# Patient Record
Sex: Male | Born: 1990
Health system: Southern US, Community
[De-identification: ages and names within clinical notes are randomized; demographics above are authoritative.]

## PROBLEM LIST (undated history)

## (undated) DIAGNOSIS — F988 Other specified behavioral and emotional disorders with onset usually occurring in childhood and adolescence: Secondary | ICD-10-CM

## (undated) DIAGNOSIS — M419 Scoliosis, unspecified: Secondary | ICD-10-CM

## (undated) DIAGNOSIS — J302 Other seasonal allergic rhinitis: Secondary | ICD-10-CM

## (undated) DIAGNOSIS — M25561 Pain in right knee: Secondary | ICD-10-CM

## (undated) DIAGNOSIS — Z973 Presence of spectacles and contact lenses: Secondary | ICD-10-CM

## (undated) DIAGNOSIS — G8929 Other chronic pain: Secondary | ICD-10-CM

## (undated) DIAGNOSIS — G4700 Insomnia, unspecified: Secondary | ICD-10-CM

## (undated) HISTORY — DX: Other seasonal allergic rhinitis: J30.2

## (undated) HISTORY — DX: Other chronic pain: G89.29

## (undated) HISTORY — DX: Scoliosis, unspecified: M41.9

## (undated) HISTORY — DX: Insomnia, unspecified: G47.00

## (undated) HISTORY — DX: Other specified behavioral and emotional disorders with onset usually occurring in childhood and adolescence: F98.8

## (undated) HISTORY — DX: Presence of spectacles and contact lenses: Z97.3

## (undated) HISTORY — DX: Pain in right knee: M25.561

---
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
## 2012-07-07 ENCOUNTER — Emergency Department (HOSPITAL_COMMUNITY): Payer: Self-pay

## 2012-07-07 ENCOUNTER — Encounter (HOSPITAL_COMMUNITY): Payer: Self-pay | Admitting: *Deleted

## 2012-07-07 ENCOUNTER — Emergency Department (HOSPITAL_COMMUNITY)
Admission: EM | Admit: 2012-07-07 | Discharge: 2012-07-08 | Disposition: A | Discharge status: Home / Self Care | Payer: Self-pay | Attending: Emergency Medicine | Admitting: Emergency Medicine

## 2012-07-07 DIAGNOSIS — S6980XA Other specified injuries of unspecified wrist, hand and finger(s), initial encounter: Secondary | ICD-10-CM | POA: Insufficient documentation

## 2012-07-07 DIAGNOSIS — S6990XA Unspecified injury of unspecified wrist, hand and finger(s), initial encounter: Secondary | ICD-10-CM | POA: Insufficient documentation

## 2012-07-07 DIAGNOSIS — R296 Repeated falls: Secondary | ICD-10-CM | POA: Insufficient documentation

## 2012-07-07 DIAGNOSIS — M79645 Pain in left finger(s): Secondary | ICD-10-CM

## 2012-07-07 NOTE — ED Notes (Signed)
Patient transported to X-ray 

## 2012-07-07 NOTE — ED Notes (Addendum)
Pt states playing basketball landed on left thumb on blechers. Pt states that his thumb has been hurting since. Pt able to wiggle thumb and CNS intact. No swelling noted. Pt states hurts through his palm as well.

## 2012-07-08 MED ORDER — IBUPROFEN 400 MG PO TABS: 800.0000 mg | ORAL_TABLET | Freq: Once | ORAL | Status: DC

## 2012-07-08 MED ORDER — HYDROCODONE-ACETAMINOPHEN 5-325 MG PO TABS: 1.0000 | ORAL_TABLET | Freq: Four times a day (QID) | ORAL | Status: AC | PRN

## 2012-07-08 MED ORDER — IBUPROFEN 800 MG PO TABS: 800.0000 mg | ORAL_TABLET | Freq: Three times a day (TID) | ORAL | Status: AC | PRN

## 2012-07-08 NOTE — ED Provider Notes (Signed)
Medical screening examination/treatment/procedure(s) were performed by non-physician practitioner and as supervising physician I was immediately available for consultation/collaboration.    Vida Roller, MD 07/08/12 [phone]

## 2012-07-08 NOTE — Progress Notes (Signed)
Orthopedic Tech Progress Note Patient Details:  Connor Barton 1991/05/17 [id number]  Ortho Devices Type of Ortho Device: Thumb velcro splint Ortho Device/Splint Location: Left thumb Ortho Device/Splint Interventions: Application   Asia R Thompson 07/08/2012, 2:31 AM

## 2012-07-08 NOTE — ED Provider Notes (Signed)
History     CSN: [id number]  Arrival date & time 07/07/12  2236   First MD Initiated Contact with Patient 07/08/12 [phone]      Chief Complaint  Patient presents with  . Finger Injury    (Consider location/radiation/quality/duration/timing/severity/associated sxs/prior treatment) HPI Comments: While playing basketball this evening patient fell backwards into bleachers onto his left hand.  States he believes he hyperextended his left thumb.  Reports pain throughout thumb.  Denies other injury.  Denies weakness or numbness of the thumb.  Denies hitting head or LOC.   The history is provided by the patient.    History reviewed. No pertinent past medical history.  History reviewed. No pertinent past surgical history.  History reviewed. No pertinent family history.  History  Substance Use Topics  . Smoking status: Never Smoker   . Smokeless tobacco: Not on file  . Alcohol Use: No      Review of Systems  Skin: Negative for color change, pallor, rash and wound.  Neurological: Negative for weakness and numbness.    Allergies  Review of patient's allergies indicates no known allergies.  Home Medications  No current outpatient prescriptions on file.  BP 138/69  Pulse 60  Temp 98.4 F (36.9 C) (Oral)  Resp 16  SpO2 99%  Physical Exam  Nursing note and vitals reviewed. Constitutional: He is oriented to person, place, and time. He appears well-developed and well-nourished. No distress.  HENT:  Head: Normocephalic and atraumatic.  Neck: Neck supple.  Pulmonary/Chest: Effort normal.  Musculoskeletal:       Left hand: He exhibits tenderness and swelling. He exhibits normal capillary refill, no deformity and no laceration. normal sensation noted.       Hands:      Left thumb with decreased flexion at mcp joint secondary to pain and swelling.  Tenderness over 1st metacarpal only.  No tenderness at base of second metacarpal (see xray) or any other metacarpals.      Neurological: He is alert and oriented to person, place, and time.  Skin: He is not diaphoretic.    ED Course  Procedures (including critical care time)  Labs Reviewed - No data to display Dg Hand Complete Left  07/07/2012  *RADIOLOGY REPORT*  Clinical Data: Left hand/wrist hyperextension injury.  LEFT HAND - COMPLETE 3+ VIEW  Comparison: None.  Findings: Subtle lucency through the base of the second metacarpal. Otherwise, no fracture or dislocation.  No aggressive osseous lesions.  IMPRESSION: Subtle lucency through the base of the second metacarpal may be projectional artifact.  Correlate with area of symptoms.  Otherwise, no acute fracture or dislocation identified.  Original Report Authenticated By: Waneta Martins, M.D.     1. Pain of left thumb       MDM  Patient with pain in left thumb after falling on outstretched hand.  Xray is negative.  (pt is not tender over identified lucency).  Pt placed in velcro thumb spica, d/c home with pain medication.  To follow up with PCP or orthopedist (his own, Dr Thurston Hole) if needed.  Return precautions given.  Patient verbalizes understanding and agrees with plan.          Dillard Cannon [place], [place] 07/08/12 [phone]

## 2012-07-08 NOTE — ED Notes (Signed)
Playing basketball and fell onto his left hand injuring the base of his left thumb.

## 2012-07-08 NOTE — Progress Notes (Signed)
Orthopedic Tech Progress Note Patient Details:  Connor Barton Aug 08, 1991 [id number] Velcro thumb absent in chrges; wrist indicated instead Ortho Devices Type of Ortho Device: Thumb velcro splint Ortho Device/Splint Location: Left thumb Ortho Device/Splint Interventions: Application   Asia R Thompson 07/08/2012, 2:34 AM

## 2012-09-06 ENCOUNTER — Encounter: Payer: Self-pay | Admitting: Medical

## 2012-09-06 ENCOUNTER — Ambulatory Visit (INDEPENDENT_AMBULATORY_CARE_PROVIDER_SITE_OTHER): Payer: PRIVATE HEALTH INSURANCE | Admitting: Medical

## 2012-09-06 VITALS — BP 120/78 | HR 58 | Temp 98.2°F | Resp 16 | Ht 73.0 in | Wt 189.0 lb

## 2012-09-06 DIAGNOSIS — F988 Other specified behavioral and emotional disorders with onset usually occurring in childhood and adolescence: Secondary | ICD-10-CM

## 2012-09-06 MED ORDER — AMPHETAMINE-DEXTROAMPHET ER 20 MG PO CP24: 20.0000 mg | ORAL_CAPSULE | ORAL | Status: DC

## 2012-09-06 NOTE — Progress Notes (Signed)
Subjective: Here as a new patient today.   Was seeing pediatrics, Dr. Zenaida Niece prior.  He is here for academic issues, focus and attention issues.  He notes starting in middle school he began having problems with academics.  He reports long history of problems focusing in school, problems with attention, having to go back over reading passages multiple times, simple distractions in the classroom can get his attention, has hard time focusing, zones out in his mind, and struggled through school.  He notes that he did well enough on tests to pass, but his day to day grades didn't reflect how bad he was doing.  He notes being taken to a counselor of some sort in downtown [place] in middle school.  Went for about 6 weeks for help in organization, test taking, focus, but these efforts didn't make much of a difference.  His mom chalked this up to him being lazy. He notes however, that he really did try to focus and do well.  He says he wasn't being lazy, but just wasn't succeeding despite his efforts.   He went to [employer] last year and did so bad he almost went on academic probation.  Instead of offering any kind of assistance or strategies, they basically told him to consider other school options.  Thus, he moved back home and enrolled at [employer].  He is in school for sports science.   He also works part time at [employer].  He denies hx/o behavior problems, no legal troubles.   Has never drank or done drugs.   He notes mood occasionally is down, but mostly happy.  Sometimes has some anhedonia.  Denies sleep issues, no compulsions or obsessions.  No hx/o learning disabilities.   No hx/o mental health illness in self of family but thinks his sister may have ADD like symptoms as well.  He notes that his current grades tend to be C average.  Made 1900 on SAT.  He notes since the family didn't have insurance growing up, he was never seen by doctors for this issue.   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    Past Medical History  Diagnosis Date  . Seasonal allergies   . Wears glasses   . Scoliosis    ROS as noted above in HPI  Exam: Gen: wd, wn, nad Psych: pleasant AA male, polite, answers questions appropriately, good eye contact, seems sincere  Assessment: Encounter Diagnosis  Name Primary?  . ADD (attention deficit disorder) Yes   Plan: Reviewed his Adult ADHD RS IV questionnaire.  He scores mostly in the 2-3 fpr attention deficit questions.  His symptoms meet criteria for ADD/ADHD:  His symptoms include fidgeting, squirming in chair, in the past blurted out answers, in the past interrupted others, symptoms affect school work and daily activities, has occurred most of his life, includes difficulty paying attention, frequently daydreaming, difficulty following through on instructions and apparently not listening, frequently has problems organizing tasks or activities , easily distracted, frequently has trouble waiting for his or her turn.    Given his concerns, frustrations, impact on school work, we will use a trial of Adderall XR 20mg  daily for help with symptoms.  discussed organization, having a routine that is consistent, specific time set aside for studying, discussed strategies to cope.   discussed risks/benefits of medication.   Recheck in 17mo.   I also recommended  he establish with psychology for additional evaluation too.  Gave contact info for local psychologists.

## 2012-09-06 NOTE — Patient Instructions (Signed)
[facility] [address], [address] [place], [place] [address] [phone] Fax    Franchot Erichsen Address: [address], [place], [place] [address]  [phone]   Attention Deficit Hyperactivity Disorder Attention deficit hyperactivity disorder (ADHD) is a problem with behavior issues based on the way the brain functions (neurobehavioral disorder). It is a common reason for behavior and academic problems in school. CAUSES  The cause of ADHD is unknown in most cases. It may run in families. It sometimes can be associated with learning disabilities and other behavioral problems. SYMPTOMS  There are 3 types of ADHD. The 3 types and some of the symptoms include:  Inattentive   Gets bored or distracted easily.   Loses or forgets things. Forgets to hand in homework.   Has trouble organizing or completing tasks.   Difficulty staying on task.   An inability to organize daily tasks and school work.   Leaving projects, chores, or homework unfinished.   Trouble paying attention or responding to details. Careless mistakes.   Difficulty following directions. Often seems like is not listening.   Dislikes activities that require sustained attention (like chores or homework).   Hyperactive-impulsive   Feels like it is impossible to sit still or stay in a seat. Fidgeting with hands and feet.   Trouble waiting turn.   Talking too much or out of turn. Interruptive.   Speaks or acts impulsively.   Aggressive, disruptive behavior.   Constantly busy or on the go, noisy.   Combined   Has symptoms of both of the above.  Often children with ADHD feel discouraged about themselves and with school. They often perform well below their abilities in school. These symptoms can cause problems in home, school, and in relationships with peers. As children get older, the excess motor activities can calm down, but the problems with  paying attention and staying organized persist. Most children do not outgrow ADHD but with good treatment can learn to cope with the symptoms. DIAGNOSIS  When ADHD is suspected, the diagnosis should be made by professionals trained in ADHD.  Diagnosis will include:  Ruling out other reasons for the child's behavior.   The caregivers will check with the child's school and check their medical records.   They will talk to teachers and parents.   Behavior rating scales for the child will be filled out by those dealing with the child on a daily basis.  A diagnosis is made only after all information has been considered. TREATMENT  Treatment usually includes behavioral treatment often along with medicines. It may include stimulant medicines. The stimulant medicines decrease impulsivity and hyperactivity and increase attention. Other medicines used include antidepressants and certain blood pressure medicines. Most experts agree that treatment for ADHD should address all aspects of the child's functioning. Treatment should not be limited to the use of medicines alone. Treatment should include structured classroom management. The parents must receive education to address rewarding good behavior, discipline, and limit-setting. Tutoring or behavioral therapy or both should be available for the child. If untreated, the disorder can have long-term serious effects into adolescence and adulthood. HOME CARE INSTRUCTIONS   Often with ADHD there is a lot of frustration among the family in dealing with the illness. There is often blame and anger that is not warranted. This is a life long illness. There is no way to prevent ADHD. In many cases, because the problem affects the family as a whole, the entire family may  need help. A therapist can help the family find better ways to handle the disruptive behaviors and promote change. If the child is young, most of the therapist's work is with the parents. Parents will learn  techniques for coping with and improving their child's behavior. Sometimes only the child with the ADHD needs counseling. Your caregivers can help you make these decisions.   Children with ADHD may need help in organizing. Some helpful tips include:   Keep routines the same every day from wake-up time to bedtime. Schedule everything. This includes homework and playtime. This should include outdoor and indoor recreation. Keep the schedule on the refrigerator or a bulletin board where it is frequently seen. Mark schedule changes as far in advance as possible.   Have a place for everything and keep everything in its place. This includes clothing, backpacks, and school supplies.   Encourage writing down assignments and bringing home needed books.   Offer your child a well-balanced diet. Breakfast is especially important for school performance. Children should avoid drinks with caffeine including:   Soft drinks.   Coffee.   Tea.   However, some older children (adolescents) may find these drinks helpful in improving their attention.   Children with ADHD need consistent rules that they can understand and follow. If rules are followed, give small rewards. Children with ADHD often receive, and expect, criticism. Look for good behavior and praise it. Set realistic goals. Give clear instructions. Look for activities that can foster success and self-esteem. Make time for pleasant activities with your child. Give lots of affection.   Parents are their children's greatest advocates. Learn as much as possible about ADHD. This helps you become a stronger and better advocate for your child. It also helps you educate your child's teachers and instructors if they feel inadequate in these areas. Parent support groups are often helpful. A national group with local chapters is called CHADD (Children and Adults with Attention Deficit Hyperactivity Disorder).  PROGNOSIS  There is no cure for ADHD. Children with the  disorder seldom outgrow it. Many find adaptive ways to accommodate the ADHD as they mature. SEEK MEDICAL CARE IF:  Your child has repeated muscle twitches, cough or speech outbursts.   Your child has sleep problems.   Your child has a marked loss of appetite.   Your child develops depression.   Your child has new or worsening behavioral problems.   Your child develops dizziness.   Your child has a racing heart.   Your child has stomach pains.   Your child develops headaches.  Document Released: 12/03/2002 Document Revised: 12/02/2011 Document Reviewed: 07/15/2008 [facility] Patient Information 2012 [place], [place].

## 2012-10-09 ENCOUNTER — Ambulatory Visit (INDEPENDENT_AMBULATORY_CARE_PROVIDER_SITE_OTHER): Payer: PRIVATE HEALTH INSURANCE | Admitting: Family Medicine

## 2012-10-09 ENCOUNTER — Encounter: Payer: Self-pay | Admitting: Family Medicine

## 2012-10-09 ENCOUNTER — Telehealth: Payer: Self-pay | Admitting: Family Medicine

## 2012-10-09 VITALS — BP 120/80 | HR 56 | Wt 184.0 lb

## 2012-10-09 DIAGNOSIS — F988 Other specified behavioral and emotional disorders with onset usually occurring in childhood and adolescence: Secondary | ICD-10-CM

## 2012-10-09 MED ORDER — AMPHETAMINE-DEXTROAMPHETAMINE 10 MG PO TABS: 10.0000 mg | ORAL_TABLET | Freq: Two times a day (BID) | ORAL | Status: DC

## 2012-10-09 MED ORDER — AMPHETAMINE-DEXTROAMPHETAMINE 12.5 MG PO TABS: 12.5000 mg | ORAL_TABLET | Freq: Two times a day (BID) | ORAL | Status: DC

## 2012-10-09 NOTE — Patient Instructions (Addendum)
For next semester try to schedule as many classes lumps together as possible. Let me know how the strength works, how long does it work and did you have any troubles.

## 2012-10-09 NOTE — Progress Notes (Signed)
  Subjective:    Patient ID: Connor Barton, male    DOB: 03-Apr-1991, 21 y.o.   MRN: [id number]  HPI He is here for recheck on ADD. He is now using Adderall XR 20 mg. He is noted a huge improvement in his focus, retention. He notes that his mind is just more quiet. The medication lasts him approximately 6 hours. He will occasionally note increased heart rate. He has no difficulty when it wears off. He does have some concerns over classes at different times during the day. 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  Review of Systems     Objective:   Physical Exam Alert and in no distress otherwise not examined      Assessment & Plan:   1. ADD (attention deficit disorder)  DISCONTINUED: amphetamine-dextroamphetamine (ADDERALL) 12.5 MG tablet   he was switched to 10 mg. He could not find 12.5 mg at several different drug stores.

## 2012-10-10 NOTE — Telephone Encounter (Signed)
ADDERALL SCRIPT CHANGED TO 10MG  AND REWRITTEN. PT PICKED UP NEW SCRIPT.

## 2012-10-17 ENCOUNTER — Telehealth: Payer: Self-pay | Admitting: Family Medicine

## 2012-10-17 MED ORDER — AMPHETAMINE-DEXTROAMPHET ER 20 MG PO CP24: 20.0000 mg | ORAL_CAPSULE | ORAL | Status: DC

## 2012-10-17 NOTE — Telephone Encounter (Signed)
He called stating that the Adderall 10 mg really did not help. He does do to 20 and found it to be less effective than the XR 20

## 2012-10-17 NOTE — Telephone Encounter (Signed)
PT CALLED AND STATED THAT NEW DOSE HE WAS GIVEN FOR HIS ADHD IS NOT WORKING AS WELL. PT STATES HE WOULD LIKE TO GO BACK ON HIS OLD DOSE. PLEASE CALL PT. HE WAS ON ADDERALL XR 20 MG ONCE A DAY AND WAS SWITCHED TO ADDERALL XR 10MG  TWICE A DAY.

## 2013-02-26 ENCOUNTER — Institutional Professional Consult (permissible substitution): Payer: Self-pay | Admitting: Medical

## 2013-03-05 ENCOUNTER — Ambulatory Visit (INDEPENDENT_AMBULATORY_CARE_PROVIDER_SITE_OTHER): Payer: PRIVATE HEALTH INSURANCE | Admitting: Medical

## 2013-03-05 ENCOUNTER — Other Ambulatory Visit: Payer: Self-pay | Admitting: Medical

## 2013-03-05 ENCOUNTER — Telehealth: Payer: Self-pay | Admitting: Family Medicine

## 2013-03-05 ENCOUNTER — Encounter: Payer: Self-pay | Admitting: Medical

## 2013-03-05 VITALS — BP 130/80 | HR 80 | Temp 98.5°F | Resp 16 | Wt 179.0 lb

## 2013-03-05 DIAGNOSIS — R109 Unspecified abdominal pain: Secondary | ICD-10-CM

## 2013-03-05 DIAGNOSIS — R112 Nausea with vomiting, unspecified: Secondary | ICD-10-CM

## 2013-03-05 DIAGNOSIS — F988 Other specified behavioral and emotional disorders with onset usually occurring in childhood and adolescence: Secondary | ICD-10-CM

## 2013-03-05 LAB — POCT URINALYSIS DIPSTICK
Ketones, UA: NEGATIVE
Leukocytes, UA: NEGATIVE
Protein, UA: NEGATIVE
Urobilinogen, UA: NEGATIVE
pH, UA: 6

## 2013-03-05 MED ORDER — AMPHETAMINE-DEXTROAMPHETAMINE 20 MG PO TABS: 20.0000 mg | ORAL_TABLET | Freq: Two times a day (BID) | ORAL | Status: DC

## 2013-03-05 MED ORDER — ONDANSETRON HCL 4 MG PO TABS: 4.0000 mg | ORAL_TABLET | Freq: Three times a day (TID) | ORAL | Status: DC | PRN

## 2013-03-05 MED ORDER — OMEPRAZOLE 40 MG PO CPDR: DELAYED_RELEASE_CAPSULE | ORAL | Status: DC

## 2013-03-05 NOTE — Telephone Encounter (Signed)
Message copied by Janeice Robinson on Mon Mar 05, 2013  3:42 PM ------      Message from: Jac Canavan      Created: Mon Mar 05, 2013  1:50 PM       i forgot to ask, but see if he wants me to also call out something he can take for nausea/vomiting?  The omeprazole is to calm down the stomach acid and to help in the event of ulcer, but I can send something specifically for N/V ------

## 2013-03-05 NOTE — Telephone Encounter (Signed)
Patient yes could please something for N/V to his pharmacy. CLS

## 2013-03-05 NOTE — Progress Notes (Signed)
Subjective: Here for f/u on ADD.  Since last visit he didn't do as well on Adderall 10mg  regular release.  Felt like it didn't seem to help as much taking this once or BID on his long days.  Went back to Adderall 20mg  XR daily.  Some days he has 1 class, some days has several classes.  On days when he has only 1 class, the XR version keeps him up, interferes with sleep.   On days with multiple classes, the 20mg  XR does fine.  Feels like he needs the XR dose 4-5 days per week.  Was seeing counselor in [place] after my last visit with him in September.  They discussed time management skills.  Overall he feels the medication is very helpful, helps him stay focused, keep attention.   Doing much better overall.    He has another c/o.  He report last few days strong pain in abdomen in the middle/epigastric.   Has been nausated.  Every time he eats he has been vomiting.  Yesterday 3 times.  Has been vomiting a few times daily.  Having loose stools about 1 time per day.  No sick contacts with similar.   Using some tums.  Denies recent heartburn, indigestion.   Denies spicy foods.  Feels sick.   No other symptoms.   Past Medical History  Diagnosis Date  . Seasonal allergies   . Wears glasses   . Scoliosis    ROS Gen: no fever, chills, sweats URI - negative Lungs: negative Cardiac negative No penile discharge, no scrotal pain Skin no rash Back no pain MSK: no arthralgia, myalgia   Objective: Filed Vitals:   03/05/13 1053  BP: 130/80  Pulse: 80  Temp: 98.5 F (36.9 C)  Resp: 16    General appearance: alert, no distress, WD/WN HEENT: normocephalic, sclerae anicteric, TMs pearly, nares patent, no discharge or erythema, pharynx normal Oral cavity: MMM, no lesions Neck: supple, no lymphadenopathy, no thyromegaly, no masses Heart: RRR, normal S1, S2, no murmurs Lungs: CTA bilaterally, no wheezes, rhonchi, or rales Abdomen: +bs, soft, tender epigastric and suprapubic region, non distended, no  masses, no hepatomegaly, no splenomegaly Pulses: 2+ symmetric, upper and lower extremities, normal cap refill  Assessment: Encounter Diagnoses  Name Primary?  . ADD (attention deficit disorder) Yes  . Abdominal pain, unspecified site   . Nausea with vomiting     Plan: ADD - change to Adderral 20mg  regular release.  Use once daily on days when he doesn't have many classes, BID on his longer school days.   This should help with both need for medication therapy but also to reduce the impact on sleep on his short days.   Call report 2wk.  Abdominal pain, nausea, vomiting - gastritis vs viral gastroenteritis vs GERD vs ulcer.  Begin trial of Omeprazole, avoid GERD triggers, BRAT diet, hydrate well in general, and if worse in the next few days, recheck, labs

## 2013-03-12 ENCOUNTER — Telehealth: Payer: Self-pay | Admitting: Medical

## 2013-03-12 NOTE — Telephone Encounter (Signed)
Is the stomach issues better?  Is he still doing the Prilosec?  The plan was to stop the prilosec in 2 wk after calming the symptoms down.  Was this message in regard to prilosec or the 20mg  regular release Adderall?

## 2013-03-12 NOTE — Telephone Encounter (Signed)
Pt called and stated that new med is working well would like refills call when ready [phone]

## 2013-03-16 ENCOUNTER — Other Ambulatory Visit: Payer: Self-pay | Admitting: Medical

## 2013-03-16 MED ORDER — AMPHETAMINE-DEXTROAMPHETAMINE 20 MG PO TABS: 20.0000 mg | ORAL_TABLET | Freq: Two times a day (BID) | ORAL | Status: DC

## 2013-03-16 NOTE — Telephone Encounter (Signed)
rx ready 

## 2013-03-16 NOTE — Telephone Encounter (Signed)
PT INFORMED RX READY

## 2013-03-16 NOTE — Telephone Encounter (Signed)
Pt stopped by and stated his stomach issues are better and he is no longer taking the Prilosec and the medication he needs is adderall 20 mg regular release

## 2013-03-19 NOTE — Telephone Encounter (Signed)
LMOM TO CB 

## 2013-03-19 NOTE — Telephone Encounter (Signed)
Done. Message taken care of by [facility]

## 2013-06-13 IMAGING — CR DG HAND COMPLETE 3+V*L*
3 series · 3 of 3 positions shown · non-contrast
Comparison: None.

CLINICAL DATA: Left hand/wrist hyperextension injury.

LEFT HAND - COMPLETE 3+ VIEW

[x hand pa left]
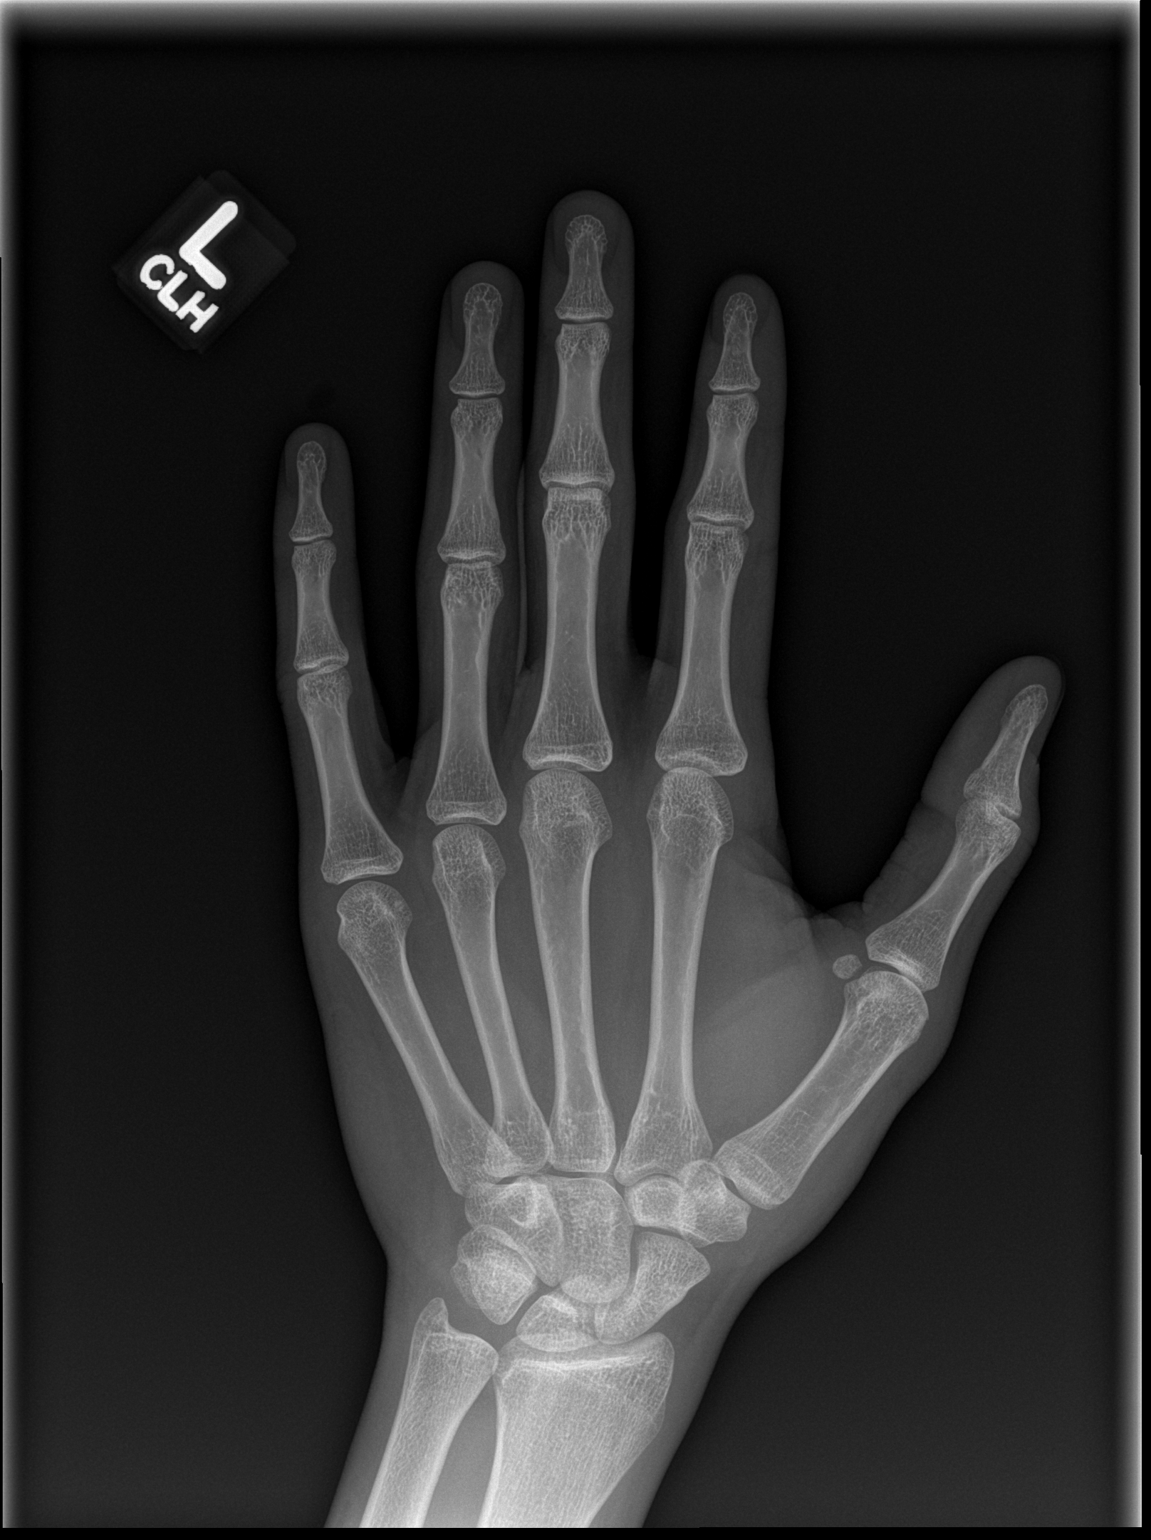

[x hand obl left]
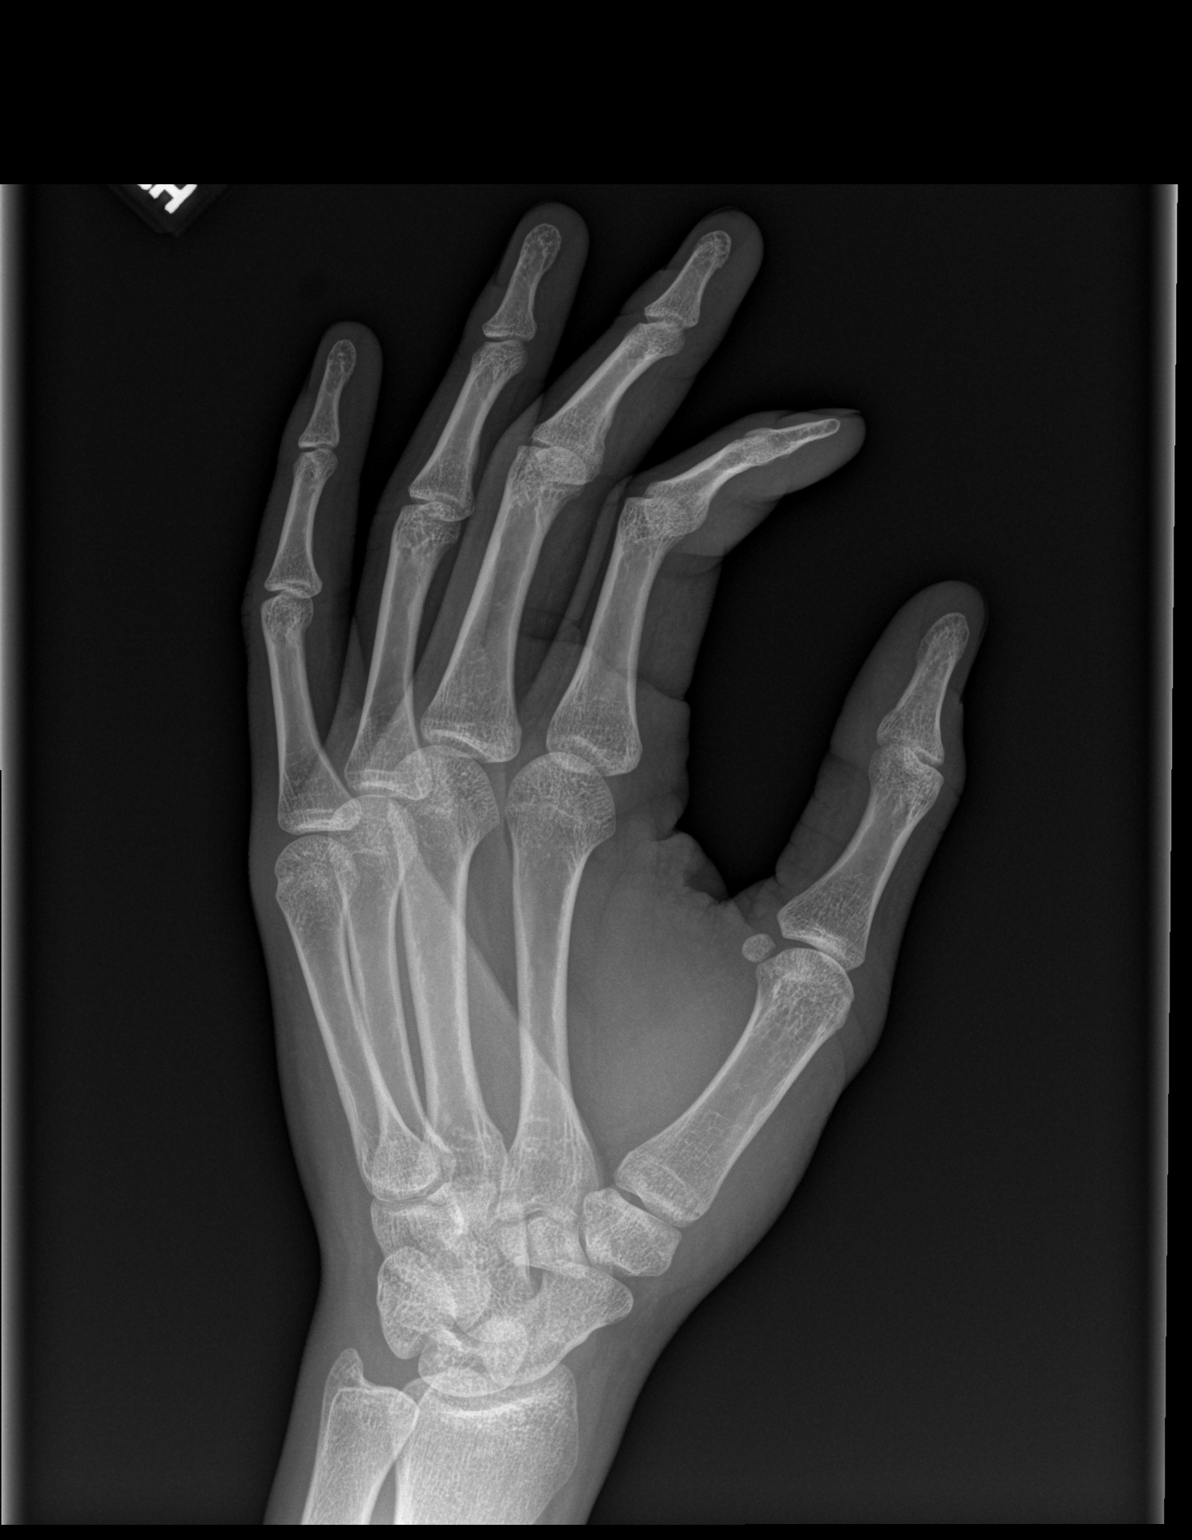

[x hand lat left]
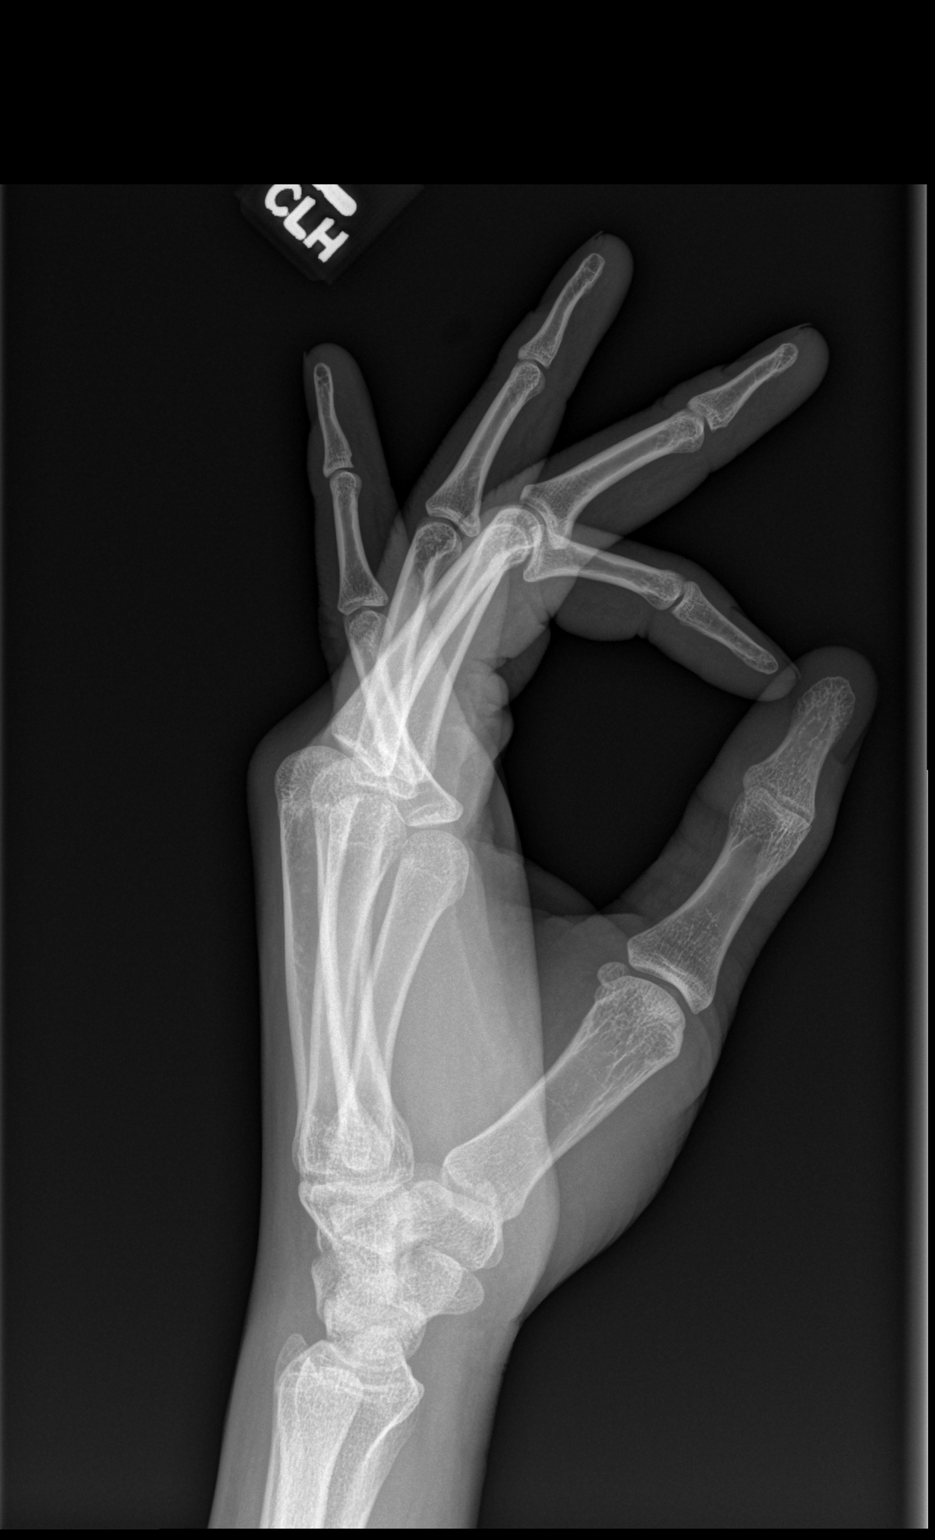

[3 of 3 positions shown; findings below may reference images not displayed]

FINDINGS: Subtle lucency through the base of the second metacarpal.
Otherwise, no fracture or dislocation.  No aggressive osseous
lesions.
IMPRESSION: Subtle lucency through the base of the second metacarpal may be
projectional artifact.  Correlate with area of symptoms.

Otherwise, no acute fracture or dislocation identified.

## 2013-07-23 ENCOUNTER — Telehealth: Payer: Self-pay | Admitting: Medical

## 2013-07-23 ENCOUNTER — Other Ambulatory Visit: Payer: Self-pay | Admitting: Medical

## 2013-07-23 MED ORDER — AMPHETAMINE-DEXTROAMPHETAMINE 20 MG PO TABS: 20.0000 mg | ORAL_TABLET | Freq: Two times a day (BID) | ORAL | Status: DC

## 2013-07-23 NOTE — Telephone Encounter (Signed)
ready

## 2013-07-24 NOTE — Telephone Encounter (Signed)
Called pt to advise that script ready for pick up °

## 2013-10-26 ENCOUNTER — Ambulatory Visit (INDEPENDENT_AMBULATORY_CARE_PROVIDER_SITE_OTHER): Payer: PRIVATE HEALTH INSURANCE | Admitting: Medical

## 2013-10-26 ENCOUNTER — Encounter: Payer: Self-pay | Admitting: Medical

## 2013-10-26 VITALS — BP 120/80 | HR 68 | Temp 98.0°F | Resp 16 | Wt 186.0 lb

## 2013-10-26 DIAGNOSIS — Z2821 Immunization not carried out because of patient refusal: Secondary | ICD-10-CM

## 2013-10-26 DIAGNOSIS — M549 Dorsalgia, unspecified: Secondary | ICD-10-CM

## 2013-10-26 DIAGNOSIS — IMO0002 Reserved for concepts with insufficient information to code with codable children: Secondary | ICD-10-CM

## 2013-10-26 DIAGNOSIS — S39012A Strain of muscle, fascia and tendon of lower back, initial encounter: Secondary | ICD-10-CM

## 2013-10-26 DIAGNOSIS — F988 Other specified behavioral and emotional disorders with onset usually occurring in childhood and adolescence: Secondary | ICD-10-CM

## 2013-10-26 MED ORDER — AMPHETAMINE-DEXTROAMPHETAMINE 5 MG PO TABS: 5.0000 mg | ORAL_TABLET | Freq: Every day | ORAL | Status: DC

## 2013-10-26 MED ORDER — CYCLOBENZAPRINE HCL 10 MG PO TABS: ORAL_TABLET | ORAL | Status: DC

## 2013-10-26 MED ORDER — IBUPROFEN 800 MG PO TABS: 800.0000 mg | ORAL_TABLET | Freq: Three times a day (TID) | ORAL | Status: DC | PRN

## 2013-10-26 MED ORDER — AMPHETAMINE-DEXTROAMPHETAMINE 20 MG PO TABS: 20.0000 mg | ORAL_TABLET | Freq: Two times a day (BID) | ORAL | Status: DC

## 2013-10-26 NOTE — Patient Instructions (Signed)
For back strain   Use 4-5 days of relative rest  Use heat pad  Begin Flexeril 1/2-1 tablet at bedtime as needed.  Caution - drowsiness  Use Ibuprofen 800mg , 2-3 times daily for the next week or so.  Take with food.  As the pain improves, use gentle stretching and range of motion exercise.  No heavy lifting, no lifting over 15 lb until this resolves.   If not much improved within a week, then let me know or recheck.

## 2013-10-26 NOTE — Progress Notes (Signed)
Subjective: Here for routine f/u on ADD.  Doing well in general.  Taking Adderall 20mg  once to twice daily.   Usually uses this 9am, wears off by 1pm.  Takes dose 4pm for studying and work.  Classes are 9am-1pm.  Bedtime and wake time routine is consistent, feels rested when awakes.  Medication not interfering with sleep.  Medication has affected appetite, but he eats more frequently and intentionally to compensate.  Making A-Bs.  At [employer] for sports science.   Exercising, plays intramural basketball, somewhat introverted, but feels happy.  Drinks 1-2 beers every other week.  Nonsmoking.  Mood doing fine, better than last time I saw him.  Denies other medication side effects.  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    ADD history: He notes starting in middle school he began having problems with academics.  He reports long history of problems focusing in school, problems with attention, having to go back over reading passages multiple times, simple distractions in the classroom can get his attention, has hard time focusing, zones out in his mind, and struggled through school.  He notes that he did well enough on tests to pass, but his day to day grades didn't reflect how bad he was doing.  He notes being taken to a counselor of some sort in downtown [place] in middle school.  Went for about 6 weeks for help in organization, test taking, focus, but these efforts didn't make much of a difference.  His mom chalked this up to him being lazy. He notes however, that he really did try to focus and do well.  He says he wasn't being lazy, but just wasn't succeeding despite his efforts.  He went to [facility] 2013 and did so bad he almost went on academic probation. Instead of offering any kind of assistance or strategies, they basically told him to consider other school options.    Back: he reports having some pain in right lower back, off the right.  This week felt mild numb feeling of right calve.    No injury, no trauma.  No prior similar.  He does play  Basekbetball, no recent heavy lifting.    Past Medical History  Diagnosis Date  . Seasonal allergies   . Wears glasses   . Scoliosis   . ADD (attention deficit disorder)    ROS as noted above in HPI  Exam: Gen: wd, wn, nad Psych: pleasant AA male, polite, answers questions appropriately, good eye contact, seems sincere Heart: RRR, no murmurs, normal S1, S2 Lungs clear Neuro: normal LE strength, sensation, DTRs, normal heel and toe walk MSK: nontender LE, no deformity Back: tender right lumbar paraspinal, pain with flexeril and extension, limited ROM of back due to pain today   Assessment: Encounter Diagnoses  Name Primary?  . ADD (attention deficit disorder) Yes  . Back pain   . Back strain, initial encounter   . Influenza vaccination declined    Plan: ADD - increase to 25mg  Adderall morning trial.  C/t prn 20mg  afternoon dose.  Doing well in general.   C/t efforts at daily consistent routine, work on getting adequate sleep, c/t healthy diet, routine exercise.  F/u 4-6 mo.  Back strain/pain - advised short term 5-7 day period of rest, no heavy lifting, Ibuprofen and Flexeril orders as below, and as symptoms improved, use gentle ROM and stretching.  Recheck if not improving 1 wk.

## 2013-12-07 ENCOUNTER — Encounter: Payer: Self-pay | Admitting: Family Medicine

## 2013-12-07 ENCOUNTER — Ambulatory Visit (INDEPENDENT_AMBULATORY_CARE_PROVIDER_SITE_OTHER): Payer: PRIVATE HEALTH INSURANCE | Admitting: Family Medicine

## 2013-12-07 VITALS — BP 120/82 | HR 90 | Wt 188.0 lb

## 2013-12-07 DIAGNOSIS — F5221 Male erectile disorder: Secondary | ICD-10-CM

## 2013-12-07 DIAGNOSIS — F988 Other specified behavioral and emotional disorders with onset usually occurring in childhood and adolescence: Secondary | ICD-10-CM

## 2013-12-07 DIAGNOSIS — F528 Other sexual dysfunction not due to a substance or known physiological condition: Secondary | ICD-10-CM

## 2013-12-07 MED ORDER — VARDENAFIL HCL 10 MG PO TABS: 10.0000 mg | ORAL_TABLET | Freq: Every day | ORAL | Status: DC | PRN

## 2013-12-07 MED ORDER — METHYLPHENIDATE HCL 10 MG PO TABS: 10.0000 mg | ORAL_TABLET | Freq: Two times a day (BID) | ORAL | Status: DC

## 2013-12-07 NOTE — Progress Notes (Signed)
Teaching Physician: Sharlot Gowda, MD Dictated By: Judithann Graves  Subjective:  Connor Barton is a 22 y.o. male who presents for evaluation of recent erectile dysfunction. Notes that this has been going on since 07/2012. Wonders if this could be related to the Adderall that he began taking in 07/2012. He has trouble achieving and maintaining an erection every time that he is intimate yet has no change in libido. If he does not take his medication he feels that he is occasionally able to achieve a low quality erection. Not taking any other medications for mood. Has had same partner before this episode. Reports no particular conflict within the relationship over this interval. Partner does express frustration when it happens. Relates that he does occasionally wake up with an erection. Rare to achieve erection and maintain it during masturbation. Able to ejaculate but this is happening earlier than he would prefer during intimacy lately. Denies that this represents a source of anxiety for him. No polyuria, polydipsia, or change in vision. He does not smoke or drink or take any other medications.  ROS negative except as in subjective.  Objective: Filed Vitals:   12/07/13 1004  BP: 120/82  Pulse: 90    Physical Exam:  General: Alert and in no distress  CV: Regular sinus rhythm without murmurs or gallops  GU: Normal male genitalia without lesions. Normal cremasteric reflexes. Testicles without masses. No femoral bruits, 2+ femoral pulses bilaterally.  Assessment and Plan: 1. ADD (attention deficit disorder) Suggested initiating trial of methylphenidate 10 mg twice daily due to possibility that his ED may be related to his Adderall.  2. ED (erectile dysfunction) of non-organic origin As above, will discontinue his Adderall and initiate trial of methylphenidate for his ADD. Recommended that in the near term he may use vardenafil 10 mg as needed to achieve and maintain an erection.  Explained  that I did not think he would need to use the Levitra on a regular basis. He will keep me informed as to his response to the Ritalin. Dr. Susann Givens was present for the encounter and agrees with the above assessment and plan.

## 2013-12-24 ENCOUNTER — Telehealth: Payer: Self-pay | Admitting: Family Medicine

## 2013-12-24 DIAGNOSIS — F5221 Male erectile disorder: Secondary | ICD-10-CM

## 2013-12-24 MED ORDER — VARDENAFIL HCL 10 MG PO TABS: 10.0000 mg | ORAL_TABLET | Freq: Every day | ORAL | Status: DC | PRN

## 2013-12-24 NOTE — Telephone Encounter (Signed)
Patient called back about medication change and levitra  Patient does not like the Ritalin at all, he tried smaller dose and larger dose and still did not like  levitra sample  Took one and had good results , he was also off all meds Then tried one Levitra with taking adderal and still had good results  Please call

## 2013-12-24 NOTE — Telephone Encounter (Signed)
He states that the Ritalin did not work and he had adverse reaction from it stating it made him feel like a zombie. He also states that the Levitra did help with his erectile problem. He notes that when he takes it it works well but on a couple of occasions without taking the medication he could not achieve and maintain an erection. I will call in Levitra and have him start back on his Adderall.

## 2014-02-05 ENCOUNTER — Telehealth: Payer: Self-pay | Admitting: Family Medicine

## 2014-02-05 MED ORDER — AMPHETAMINE-DEXTROAMPHETAMINE 20 MG PO TABS: 20.0000 mg | ORAL_TABLET | Freq: Two times a day (BID) | ORAL | Status: DC

## 2014-02-05 NOTE — Telephone Encounter (Signed)
Pt needs refills on adderall 20mg  twice a day. Call when ready.

## 2014-02-06 ENCOUNTER — Ambulatory Visit (INDEPENDENT_AMBULATORY_CARE_PROVIDER_SITE_OTHER): Payer: PRIVATE HEALTH INSURANCE | Admitting: Medical

## 2014-02-06 ENCOUNTER — Encounter: Payer: Self-pay | Admitting: Medical

## 2014-02-06 VITALS — BP 110/80 | HR 100 | Temp 98.7°F | Resp 16 | Ht 73.0 in | Wt 189.0 lb

## 2014-02-06 DIAGNOSIS — J029 Acute pharyngitis, unspecified: Secondary | ICD-10-CM

## 2014-02-06 DIAGNOSIS — J039 Acute tonsillitis, unspecified: Secondary | ICD-10-CM

## 2014-02-06 LAB — POCT MONO (EPSTEIN BARR VIRUS): Mono, POC: NEGATIVE

## 2014-02-06 LAB — POCT RAPID STREP A (OFFICE): Rapid Strep A Screen: NEGATIVE

## 2014-02-06 MED ORDER — PENICILLIN G BENZATHINE 1200000 UNIT/2ML IM SUSP
1.2000 10*6.[IU] | Freq: Once | INTRAMUSCULAR | Status: AC
  Administered 2014-02-06: 1.2 10*6.[IU] via INTRAMUSCULAR

## 2014-02-06 NOTE — Progress Notes (Signed)
Subjective:  Connor Barton is a 23 y.o. male who presents for evaluation of sore throat.  He has not had a recent close exposure to someone with proven streptococcal pharyngitis.  Associated symptoms include right ear pain, headache, pain with swallowing, not feeling well x few days.  Denies fever, chills, NVD, rash, cough.  Treatment to date: ibuprofen.  No sick contacts.  No other aggravating or relieving factors.  No other c/o.  The following portions of the patient's history were reviewed and updated as appropriate: allergies, current medications, past medical history, past social history, past surgical history and problem list.  ROS as in subjective   Objective: Filed Vitals:   02/06/14 1539  BP: 110/80  Pulse: 100  Temp: 98.7 F (37.1 C)  Resp: 16    General appearance: no distress, WD/WN,ill-appearing HEENT: normocephalic, conjunctiva/corneas normal, sclerae anicteric, nares patent, no discharge or erythema, pharynx with erythema, bilat 2+ tonsil swelling, purulent exudate.  Oral cavity: MMM, no lesions  Neck: supple, shoddy anterior tender nodes, no thyromegaly Heart: RRR, normal S1, S2, no murmurs Lungs: CTA bilaterally, no wheezes, rhonchi, or rales Abdomen: +bs, soft, non tender, non distended, no masses, no hepatomegaly, no splenomegaly Skin: warm, dry, no rash  Laboratory Strep test done. Results:negative.  Mono test done, negative result.    Assessment and Plan: Encounter Diagnoses  Name Primary?  . Acute tonsillitis Yes  . Sore throat     Advised that symptoms and exam suggest a bacterial etiology.  Discussed period of contagion, note given for work.  Discussed symptomatic treatment including salt water gargles, warm fluids, rest, hydrate well, can use over-the-counter Ibuprofen for throat pain, fever, or malaise. If worse or not improving within 2-3 days, call or return.

## 2014-02-06 NOTE — Telephone Encounter (Signed)
lm

## 2014-02-07 ENCOUNTER — Telehealth: Payer: Self-pay | Admitting: Medical

## 2014-02-07 ENCOUNTER — Ambulatory Visit (INDEPENDENT_AMBULATORY_CARE_PROVIDER_SITE_OTHER): Payer: PRIVATE HEALTH INSURANCE | Admitting: Family Medicine

## 2014-02-07 VITALS — BP 110/70 | HR 89 | Temp 98.3°F | Resp 18 | Ht 72.5 in | Wt 181.0 lb

## 2014-02-07 DIAGNOSIS — R1310 Dysphagia, unspecified: Secondary | ICD-10-CM

## 2014-02-07 DIAGNOSIS — J029 Acute pharyngitis, unspecified: Secondary | ICD-10-CM

## 2014-02-07 DIAGNOSIS — J039 Acute tonsillitis, unspecified: Secondary | ICD-10-CM

## 2014-02-07 LAB — POCT CBC
Granulocyte percent: 64.7 %G (ref 37–80)
HCT, POC: 43.2 % — AB (ref 43.5–53.7)
Hemoglobin: 13.4 g/dL — AB (ref 14.1–18.1)
Lymph, poc: 1.6 (ref 0.6–3.4)
MCH, POC: 27.9 pg (ref 27–31.2)
MCHC: 31 g/dL — AB (ref 31.8–35.4)
MCV: 90 fL (ref 80–97)
MID (cbc): 0.6 (ref 0–0.9)
MPV: 8.6 fL (ref 0–99.8)
POC Granulocyte: 4.1 (ref 2–6.9)
POC LYMPH PERCENT: 26.1 %L (ref 10–50)
POC MID %: 9.2 % (ref 0–12)
Platelet Count, POC: 153 10*3/uL (ref 142–424)
RBC: 4.8 M/uL (ref 4.69–6.13)
RDW, POC: 13.1 %
WBC: 6.3 10*3/uL (ref 4.6–10.2)

## 2014-02-07 LAB — POCT RAPID STREP A (OFFICE): Rapid Strep A Screen: NEGATIVE

## 2014-02-07 MED ORDER — AMOXICILLIN 875 MG PO TABS: 875.0000 mg | ORAL_TABLET | Freq: Two times a day (BID) | ORAL | Status: DC

## 2014-02-07 MED ORDER — HYDROCODONE-ACETAMINOPHEN 7.5-325 MG/15ML PO SOLN: 15.0000 mL | Freq: Three times a day (TID) | ORAL | Status: DC | PRN

## 2014-02-07 MED ORDER — METHYLPREDNISOLONE (PAK) 4 MG PO TABS: ORAL_TABLET | ORAL | Status: DC

## 2014-02-07 MED ORDER — FIRST-DUKES MOUTHWASH MT SUSP: OROMUCOSAL | Status: DC

## 2014-02-07 NOTE — Telephone Encounter (Signed)
Left message for pt to call back and schedule appt with shane tomorrow per [employer]

## 2014-02-07 NOTE — Telephone Encounter (Signed)
[facility]

## 2014-02-07 NOTE — Progress Notes (Signed)
Chief Complaint:  Chief Complaint  Patient presents with  . Sore Throat    x 4 days  . Dysphagia    x 2 days    HPI: Connor Barton is a 23 y.o. male who is here for  4 day history of sore throat , enlarged tonsils, pain with swallowing, poor PO intake.  He was [facility] yesterday and was evaluated and treated,  His rapid strep and mono test came back negative. Was given a injection of PCN  Which helped relieve the sxs but he is still having pain.  The swelling on the left tonsil went down but the right side still feels inflamed. He may have had a fever yesterday. Can't sleep, feels he may be SOB since the tonsils are so big.  Student at [employer]  Past Medical History  Diagnosis Date  . Seasonal allergies   . Wears glasses   . Scoliosis   . ADD (attention deficit disorder)    History reviewed. No pertinent past surgical history. History   Social History  . Marital Status: Single    Spouse Name: N/A    Number of Children: N/A  . Years of Education: N/A   Social History Main Topics  . Smoking status: Never Smoker   . Smokeless tobacco: None  . Alcohol Use: No  . Drug Use: No  . Sexual Activity: None   Other Topics Concern  . None   Social History Narrative  . None   Family History  Problem Relation Age of Onset  . Healthy Mother   . Hypertension Father   . Healthy Sister   . Healthy Brother   . Healthy Maternal Grandmother   . Healthy Paternal Grandmother   . Healthy Paternal Grandfather    No Known Allergies Prior to Admission medications   Medication Sig Start Date End Date Taking? Authorizing Provider  amphetamine-dextroamphetamine (ADDERALL) 20 MG tablet Take 1 tablet (20 mg total) by mouth 2 (two) times daily. 02/05/14  Yes Ronnald NianJohn C Lalonde, MD  amphetamine-dextroamphetamine (ADDERALL) 20 MG tablet Take 1 tablet (20 mg total) by mouth 2 (two) times daily. 03/05/14  Yes Ronnald NianJohn C Lalonde, MD  amphetamine-dextroamphetamine (ADDERALL)  20 MG tablet Take 1 tablet (20 mg total) by mouth 2 (two) times daily. 04/05/14  Yes Ronnald NianJohn C Lalonde, MD  ibuprofen (ADVIL,MOTRIN) 800 MG tablet Take 1 tablet (800 mg total) by mouth every 8 (eight) hours as needed for pain. 10/26/13   Kermit Baloavid S Tysinger, PA-C  methylphenidate (RITALIN) 10 MG tablet Take 1 tablet (10 mg total) by mouth 2 (two) times daily. 12/07/13   Ronnald NianJohn C Lalonde, MD  vardenafil (LEVITRA) 10 MG tablet Take 1 tablet (10 mg total) by mouth daily as needed for erectile dysfunction. 12/24/13   Ronnald NianJohn C Lalonde, MD     ROS: The patient denies fevers, chills, night sweats, unintentional weight loss, chest pain, palpitations, wheezing, dyspnea on exertion, nausea, vomiting, abdominal pain, dysuria, hematuria, melena, numbness, weakness, or tingling.   All other systems have been reviewed and were otherwise negative with the exception of those mentioned in the HPI and as above.    PHYSICAL EXAM: Filed Vitals:   02/07/14 1838  BP: 110/70  Pulse: 89  Temp: 98.3 F (36.8 C)  Resp: 18   Filed Vitals:   02/07/14 1838  Height: 6' 0.5" (1.842 m)  Weight: 181 lb (82.101 kg)   Body mass index is 24.2 kg/(m^2).  General:  Alert, no acute distress HEENT:  Normocephalic, atraumatic, oropharynx patent. EOMI, PERRLA. TM nl. + enlarged ,erythematous +3 tonsils with exudates Cardiovascular:  Regular rate and rhythm, no rubs murmurs or gallops.  No Carotid bruits, radial pulse intact. No pedal edema.  Respiratory: Clear to auscultation bilaterally.  No wheezes, rales, or rhonchi.  No cyanosis, no use of accessory musculature GI: No organomegaly, abdomen is soft and non-tender, positive bowel sounds.  No masses. Skin: No rashes. Neurologic: Facial musculature symmetric. Psychiatric: Patient is appropriate throughout our interaction. Lymphatic: No cervical lymphadenopathy Musculoskeletal: Gait intact.   LABS: Results for orders placed in visit on 02/07/14  POCT RAPID STREP A (OFFICE)       Result Value Ref Range   Rapid Strep A Screen Negative  Negative  POCT CBC      Result Value Ref Range   WBC 6.3  4.6 - 10.2 K/uL   Lymph, poc 1.6  0.6 - 3.4   POC LYMPH PERCENT 26.1  10 - 50 %L   MID (cbc) 0.6  0 - 0.9   POC MID % 9.2  0 - 12 %M   POC Granulocyte 4.1  2 - 6.9   Granulocyte percent 64.7  37 - 80 %G   RBC 4.80  4.69 - 6.13 M/uL   Hemoglobin 13.4 (*) 14.1 - 18.1 g/dL   HCT, POC 16.1 (*) 09.6 - 53.7 %   MCV 90.0  80 - 97 fL   MCH, POC 27.9  27 - 31.2 pg   MCHC 31.0 (*) 31.8 - 35.4 g/dL   RDW, POC 04.5     Platelet Count, POC 153  142 - 424 K/uL   MPV 8.6  0 - 99.8 fL     EKG/XRAY:   Primary read interpreted by Dr. Conley Rolls at [facility].   ASSESSMENT/PLAN: Encounter Diagnoses  Name Primary?  . Acute pharyngitis Yes  . Tonsillitis   . Odynophagia    Rx Amoxacillin , MEdrol dose pack, duke's magic mouth wash and lortab elixir prn severe pain Salt water gargles F/u prn  Gross sideeffects, risk and benefits, and alternatives of medications d/w patient. Patient is aware that all medications have potential sideeffects and we are unable to predict every sideeffect or drug-drug interaction that may occur.  Hamilton Capri PHUONG, DO 02/08/2014 6:36 AM

## 2014-05-10 ENCOUNTER — Ambulatory Visit: Payer: PRIVATE HEALTH INSURANCE | Admitting: Medical

## 2014-05-15 ENCOUNTER — Encounter: Payer: Self-pay | Admitting: Medical

## 2014-05-15 ENCOUNTER — Telehealth: Payer: Self-pay | Admitting: Family Medicine

## 2014-05-15 ENCOUNTER — Ambulatory Visit (INDEPENDENT_AMBULATORY_CARE_PROVIDER_SITE_OTHER): Payer: PRIVATE HEALTH INSURANCE | Admitting: Medical

## 2014-05-15 ENCOUNTER — Telehealth: Payer: Self-pay | Admitting: Medical

## 2014-05-15 VITALS — BP 120/70 | HR 80 | Temp 98.0°F | Resp 16 | Wt 187.0 lb

## 2014-05-15 DIAGNOSIS — M25561 Pain in right knee: Secondary | ICD-10-CM

## 2014-05-15 DIAGNOSIS — F988 Other specified behavioral and emotional disorders with onset usually occurring in childhood and adolescence: Secondary | ICD-10-CM

## 2014-05-15 DIAGNOSIS — M25569 Pain in unspecified knee: Secondary | ICD-10-CM

## 2014-05-15 MED ORDER — AMPHETAMINE-DEXTROAMPHETAMINE 20 MG PO TABS: 20.0000 mg | ORAL_TABLET | Freq: Two times a day (BID) | ORAL | Status: DC

## 2014-05-15 NOTE — Telephone Encounter (Signed)
PATIENT IS AWARE OF HIS APPOINTMENT MAY 20,2015 AT 300 PM [facility] CLS [phone]

## 2014-05-15 NOTE — Telephone Encounter (Signed)
Refer to [facility] for chronic knee pain

## 2014-05-15 NOTE — Telephone Encounter (Signed)
Working on the referral. CLS 

## 2014-05-15 NOTE — Progress Notes (Signed)
Subjective: Here for recheck on ADD.  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   Still working at [employer].  taking Adderall 20mg  once to twice daily. 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  Plans after college - wants to pursue internship or position in sports related career.  He had a short term trial of Ritalin few months back, but it made him feel like a zombie.  Takes Adderral most days at 10am and 3pm.   No issues with sleep or appetite on the medication.   medication seems to be working fine.   ADD history:  He notes starting in middle school he began having problems with academics. He reports long history of problems focusing in school, problems with attention, having to go back over reading passages multiple times, simple distractions in the classroom can get his attention, has hard time focusing, zones out in his mind, and struggled through school. He notes that he did well enough on tests to pass, but his day to day grades didn't reflect how bad he was doing. He notes being taken to a counselor of some sort in downtown [place] in middle school. Went for about 6 weeks for help in organization, test taking, focus, but these efforts didn't make much of a difference. His mom chalked this up to him being lazy. He notes however, that he really did try to focus and do well. He says he wasn't being lazy, but just wasn't succeeding despite his efforts. He went to [employer] 2013 and did so bad he almost went on academic probation. Instead of offering any kind of assistance or strategies, they basically told him to consider other school options.   Knee pain - he reports right knee started Freshman year of high school with jumpers knee.  Had problems throughout high school with knee.  December was last time he played basketball.  Been trying to exercise, but continuing to get pain right leg, below knee at patella tendon.  Worse with running or jumping or putting  pressure on toes such as going down stairs.  No prior trauma or injury.  No prior surgery.  No prior orthopedist.   Uses legs stretches and exercises, rest, ice.  When bad will use ibuprofen.  Causes pain 4-5 days per week.  Limits his activity.  Has used cho path before. Has seen PT in remote past.  ROS as in subjective  Past Medical History  Diagnosis Date  . Seasonal allergies   . Wears glasses   . Scoliosis   . ADD (attention deficit disorder)    History reviewed. No pertinent past surgical history.   Objective: Filed Vitals:   05/15/14 1125  BP: 120/70  Pulse: 80  Temp: 98 F (36.7 C)  Resp: 16    General appearance: alert, no distress, WD/WN Heart: RRR, normal S1, S2, no murmurs Lungs: CTA bilaterally, no wheezes, rhonchi, or rales Abdomen: +bs, soft, non tender, non distended, no masses, no hepatomegaly, no splenomegaly Pulses: 2+ symmetric, upper and lower extremities, normal cap refill Ext: no edema Neuro: normal sensation, strength, DTRs. MSK: right knee with tenderness over patellar tendon, pain with McMurray test, pain with knee flexion, but no swelling, no deformity, otherwise nontender, normal ROM, rest of leg exam unremarkable   Assessment: Encounter Diagnoses  Name Primary?  . ADD (attention deficit disorder) Yes  . Knee pain, right      Plan:  ADD - c/t Adderall 20mg   BID, current doing fine with this.  Discussed summer time use of medication vs fall semester.    Knee pain - patellar tendonitis but possible meniscal injury as well.  Referral to orthopedics given the long term nature of his problem despite prior treatments.  F/u pending referral

## 2014-06-28 ENCOUNTER — Emergency Department (HOSPITAL_COMMUNITY)
Admission: EM | Admit: 2014-06-28 | Discharge: 2014-06-28 | Disposition: A | Discharge status: Home / Self Care | Payer: PRIVATE HEALTH INSURANCE | Source: Home / Self Care | Attending: Family Medicine | Admitting: Family Medicine

## 2014-06-28 ENCOUNTER — Encounter (HOSPITAL_COMMUNITY): Payer: Self-pay | Admitting: Emergency Medicine

## 2014-06-28 DIAGNOSIS — L03031 Cellulitis of right toe: Secondary | ICD-10-CM

## 2014-06-28 DIAGNOSIS — L03039 Cellulitis of unspecified toe: Secondary | ICD-10-CM

## 2014-06-28 MED ORDER — DOXYCYCLINE HYCLATE 100 MG PO CAPS: 100.0000 mg | ORAL_CAPSULE | Freq: Two times a day (BID) | ORAL | Status: DC

## 2014-06-28 NOTE — ED Notes (Signed)
C/o pain R great toe from ingrown toenail onset Sunday.

## 2014-06-28 NOTE — ED Notes (Signed)
Tefla applied to right great toe and dressed with 1" kling. Patient tolerated well.

## 2014-06-28 NOTE — ED Provider Notes (Signed)
Connor Barton is a 23 y.o. male who presents to Urgent Care today for right medial toe pain. Symptoms present for one week and worsening. Patient notes swelling and pus. No fevers or chills nausea vomiting or diarrhea. No medications tried yet. Worse with activity.   Past Medical History  Diagnosis Date  . Seasonal allergies   . Wears glasses   . Scoliosis   . ADD (attention deficit disorder)    History  Substance Use Topics  . Smoking status: Never Smoker   . Smokeless tobacco: Not on file  . Alcohol Use: No   ROS as above Medications: No current facility-administered medications for this encounter.   Current Outpatient Prescriptions  Medication Sig Dispense Refill  . [START ON 08/15/2014] amphetamine-dextroamphetamine (ADDERALL) 20 MG tablet Take 1 tablet (20 mg total) by mouth 2 (two) times daily.  60 tablet  0  . doxycycline (VIBRAMYCIN) 100 MG capsule Take 1 capsule (100 mg total) by mouth 2 (two) times daily.  14 capsule  0    Exam:  BP 134/87  Pulse 75  Temp(Src) 98.5 F (36.9 C) (Oral)  Resp 16  SpO2 98% Gen: Well NAD Right great toe: Swollen and erythematous medial border of the great toenail.  Mildly fluctuant. Small amount of pus expressible.   Drainage of paronychia Consent obtained and timeout performed The area was cleaned with alcohol The medial corner of the nail bed was elevated using the blunt end of an 11 blade scalpel. A small amount of pus was expressed.  There remained a fluctuant area. Cold spray was applied and sharp incision was used to cut just under the dermis. No pus expressible. A dressing was applied. Patient tolerated the procedure well  No results found for this or any previous visit (from the past 24 hour(s)). No results found.  Assessment and Plan: 23 y.o. male with paronychia right great toenail. Treatment with doxycycline. Followup as needed. Postoperative shoe as needed  Discussed warning signs or symptoms. Please see  discharge instructions. Patient expresses understanding.    Rodolph BongEvan S Jakhai Fant, MD 06/28/14 2013

## 2014-06-28 NOTE — Discharge Instructions (Signed)
Thank you for coming in today. Come back as needed.    Paronychia Paronychia is an inflammatory reaction involving the folds of the skin surrounding the fingernail or toenail. This is commonly caused by an infection in the skin around a nail. The most common cause of paronychia is frequent wetting of the hands (as seen with bartenders, food servers, nurses or others who wet their hands). This makes the skin around the fingernail susceptible to infection by bacteria (germs) or fungus. Other predisposing factors are:  Aggressive manicuring.  Nail biting.  Thumb sucking. The most common cause is a staphylococcal (a type of germ) infection, or a fungal (Candida) infection. When caused by a germ, it usually comes on suddenly with redness, swelling, pus and is often painful. It may get under the nail and form an abscess (collection of pus), or form an abscess around the nail. If the nail itself is infected with a fungus, the treatment is usually prolonged and may require oral medicine for up to one year. Your caregiver will determine the length of time treatment is required. The paronychia caused by bacteria (germs) may largely be avoided by not pulling on hangnails or picking at cuticles. When the infection occurs at the tips of the finger it is called felon. When the cause of paronychia is from the herpes simplex virus (HSV) it is called herpetic whitlow. TREATMENT  When an abscess is present treatment is often incision and drainage. This means that the abscess must be cut open so the pus can get out. When this is done, the following home care instructions should be followed. HOME CARE INSTRUCTIONS   It is important to keep the affected fingers very dry. Rubber or plastic gloves over cotton gloves should be used whenever the hand must be placed in water.  Keep wound clean, dry and dressed as suggested by your caregiver between warm soaks or warm compresses.  Soak in warm water for fifteen to twenty  minutes three to four times per day for bacterial infections. Fungal infections are very difficult to treat, so often require treatment for long periods of time.  For bacterial (germ) infections take antibiotics (medicine which kill germs) as directed and finish the prescription, even if the problem appears to be solved before the medicine is gone.  Only take over-the-counter or prescription medicines for pain, discomfort, or fever as directed by your caregiver. SEEK IMMEDIATE MEDICAL CARE IF:  You have redness, swelling, or increasing pain in the wound.  You notice pus coming from the wound.  You have a fever.  You notice a bad smell coming from the wound or dressing. Document Released: 06/08/2001 Document Revised: 03/06/2012 Document Reviewed: 02/07/2009 [facility] Patient Information 2015 [employer]. This information is not intended to replace advice given to you by your health care provider. Make sure you discuss any questions you have with your health care provider.

## 2014-08-12 ENCOUNTER — Telehealth: Payer: Self-pay | Admitting: Medical

## 2014-08-12 ENCOUNTER — Other Ambulatory Visit: Payer: Self-pay | Admitting: Medical

## 2014-08-12 MED ORDER — AMPHETAMINE-DEXTROAMPHETAMINE 20 MG PO TABS: 20.0000 mg | ORAL_TABLET | Freq: Two times a day (BID) | ORAL | Status: DC

## 2014-08-12 NOTE — Telephone Encounter (Signed)
rx ready 

## 2014-08-13 NOTE — Telephone Encounter (Signed)
Left message on pt vm that med was ready that he could come by and pick those up

## 2014-12-05 ENCOUNTER — Telehealth: Payer: Self-pay | Admitting: Internal Medicine

## 2014-12-05 ENCOUNTER — Other Ambulatory Visit: Payer: Self-pay | Admitting: Medical

## 2014-12-05 MED ORDER — AMPHETAMINE-DEXTROAMPHETAMINE 20 MG PO TABS: 20.0000 mg | ORAL_TABLET | Freq: Two times a day (BID) | ORAL | Status: DC

## 2014-12-05 NOTE — Telephone Encounter (Signed)
Ready for pick up

## 2014-12-05 NOTE — Telephone Encounter (Signed)
Pt needs a refill on his adderall 20mg  call when ready

## 2014-12-06 ENCOUNTER — Other Ambulatory Visit: Payer: Self-pay | Admitting: Family Medicine

## 2014-12-06 MED ORDER — AMPHETAMINE-DEXTROAMPHETAMINE 20 MG PO TABS: 20.0000 mg | ORAL_TABLET | Freq: Two times a day (BID) | ORAL | Status: DC

## 2014-12-06 NOTE — Telephone Encounter (Signed)
Pt informed

## 2015-01-15 ENCOUNTER — Telehealth: Payer: Self-pay | Admitting: Medical

## 2015-01-15 ENCOUNTER — Other Ambulatory Visit: Payer: Self-pay | Admitting: Medical

## 2015-01-15 MED ORDER — AMPHETAMINE-DEXTROAMPHETAMINE 20 MG PO TABS: 20.0000 mg | ORAL_TABLET | Freq: Two times a day (BID) | ORAL | Status: DC

## 2015-01-15 NOTE — Telephone Encounter (Signed)
Pt called and needs a refill on Adderall. Please call [phone] when ready.

## 2015-01-15 NOTE — Telephone Encounter (Signed)
rx ready, and make appt before next script due.    This medication requires routine f/u and his apt on this was 04/2014, so due for f/u

## 2015-01-16 NOTE — Telephone Encounter (Signed)
Sabrina left message for pt to call.

## 2015-01-22 ENCOUNTER — Ambulatory Visit: Payer: PRIVATE HEALTH INSURANCE | Admitting: Medical

## 2015-01-23 ENCOUNTER — Ambulatory Visit (INDEPENDENT_AMBULATORY_CARE_PROVIDER_SITE_OTHER): Payer: PRIVATE HEALTH INSURANCE | Admitting: Medical

## 2015-01-23 ENCOUNTER — Encounter: Payer: Self-pay | Admitting: Medical

## 2015-01-23 VITALS — BP 116/80 | HR 62 | Temp 98.5°F | Resp 15 | Wt 194.0 lb

## 2015-01-23 DIAGNOSIS — F909 Attention-deficit hyperactivity disorder, unspecified type: Secondary | ICD-10-CM

## 2015-01-23 DIAGNOSIS — F988 Other specified behavioral and emotional disorders with onset usually occurring in childhood and adolescence: Secondary | ICD-10-CM

## 2015-01-23 MED ORDER — AMPHETAMINE-DEXTROAMPHETAMINE 20 MG PO TABS: 20.0000 mg | ORAL_TABLET | Freq: Two times a day (BID) | ORAL | Status: DC

## 2015-01-23 NOTE — Progress Notes (Signed)
Subjective: Here for recheck on ADD.  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   Still working at [employer].  taking Adderall 20mg  once to twice daily.  Plans after college - wants to pursue internship or position in sports related career such as Runner, broadcasting/film/videoathletic director.  No issues with sleep or appetite on the medication.   medication seems to be working fine. No family hx/o heart disease.  ADD history:  He notes starting in middle school he began having problems with academics. He reports long history of problems focusing in school, problems with attention, having to go back over reading passages multiple times, simple distractions in the classroom can get his attention, has hard time focusing, zones out in his mind, and struggled through school. He notes that he did well enough on tests to pass, but his day to day grades didn't reflect how bad he was doing. He notes being taken to a counselor of some sort in downtown [place] in middle school. Went for about 6 weeks for help in organization, test taking, focus, but these efforts didn't make much of a difference. His mom chalked this up to him being lazy. He notes however, that he really did try to focus and do well. He says he wasn't being lazy, but just wasn't succeeding despite his efforts. He went to [employer] 2013 and did so bad he almost went on academic probation. Instead of offering any kind of assistance or strategies, they basically told him to consider other school options.   ROS as in subjective  Past Medical History  Diagnosis Date  . Seasonal allergies   . Wears glasses   . Scoliosis   . ADD (attention deficit disorder)    History reviewed. No pertinent past surgical history.   Objective: Filed Vitals:   01/23/15 1200  BP: 116/80  Pulse: 62  Temp: 98.5 F (36.9 C)  Resp: 15    General appearance: alert, no distress, WD/WN Heart: RRR, normal S1, S2, no murmurs Lungs: CTA bilaterally, no wheezes, rhonchi, or rales Pulses:  2+ symmetric, upper and lower extremities, normal cap refill Ext: no edema Neuro: normal sensation, strength, DTRs.   Assessment: Encounter Diagnosis  Name Primary?  . ADD (attention deficit disorder) Yes    Plan:  ADD - c/t Adderall 20mg  BID, current doing fine with this.  Discussed summer time use of medication vs fall semester.    Return 14mo for CPX

## 2015-04-07 ENCOUNTER — Telehealth: Payer: Self-pay | Admitting: Medical

## 2015-04-07 ENCOUNTER — Other Ambulatory Visit: Payer: Self-pay | Admitting: Medical

## 2015-04-07 MED ORDER — AMPHETAMINE-DEXTROAMPHETAMINE 20 MG PO TABS: 20.0000 mg | ORAL_TABLET | Freq: Two times a day (BID) | ORAL | Status: DC

## 2015-04-07 NOTE — Telephone Encounter (Signed)
Pt called for refill of adderall. Please call [phone] when ready.

## 2015-04-07 NOTE — Telephone Encounter (Signed)
rx ready 

## 2015-08-18 ENCOUNTER — Telehealth: Payer: Self-pay

## 2015-08-18 NOTE — Telephone Encounter (Signed)
Pt called wanting to refill his Adderall  #60.

## 2015-08-19 ENCOUNTER — Other Ambulatory Visit: Payer: Self-pay | Admitting: Medical

## 2015-08-19 MED ORDER — AMPHETAMINE-DEXTROAMPHETAMINE 20 MG PO TABS: 20.0000 mg | ORAL_TABLET | Freq: Two times a day (BID) | ORAL | Status: DC

## 2015-08-19 NOTE — Telephone Encounter (Signed)
Script ready, and after these 2 run out, lets schedule for physical and preventative care visit, not just a med check.

## 2015-08-19 NOTE — Telephone Encounter (Signed)
Called pt and left a message to pick up prescriptions. When he comes to get them I will schedule his physical and preventative care visit.

## 2015-08-28 HISTORY — PX: NO PAST SURGERIES: SHX2092

## 2015-09-08 ENCOUNTER — Encounter: Payer: Self-pay | Admitting: Medical

## 2015-09-08 ENCOUNTER — Ambulatory Visit (INDEPENDENT_AMBULATORY_CARE_PROVIDER_SITE_OTHER): Payer: PRIVATE HEALTH INSURANCE | Admitting: Medical

## 2015-09-08 VITALS — BP 122/90 | HR 80 | Temp 98.3°F | Resp 18 | Ht 72.75 in | Wt 204.2 lb

## 2015-09-08 DIAGNOSIS — F988 Other specified behavioral and emotional disorders with onset usually occurring in childhood and adolescence: Secondary | ICD-10-CM

## 2015-09-08 DIAGNOSIS — F909 Attention-deficit hyperactivity disorder, unspecified type: Secondary | ICD-10-CM | POA: Diagnosis not present

## 2015-09-08 DIAGNOSIS — F411 Generalized anxiety disorder: Secondary | ICD-10-CM | POA: Diagnosis not present

## 2015-09-08 DIAGNOSIS — M25569 Pain in unspecified knee: Secondary | ICD-10-CM

## 2015-09-08 DIAGNOSIS — M25561 Pain in right knee: Secondary | ICD-10-CM | POA: Diagnosis not present

## 2015-09-08 DIAGNOSIS — Z2821 Immunization not carried out because of patient refusal: Secondary | ICD-10-CM

## 2015-09-08 DIAGNOSIS — G8929 Other chronic pain: Secondary | ICD-10-CM | POA: Insufficient documentation

## 2015-09-08 DIAGNOSIS — Z0000 Encounter for general adult medical examination without abnormal findings: Secondary | ICD-10-CM | POA: Diagnosis not present

## 2015-09-08 DIAGNOSIS — Z818 Family history of other mental and behavioral disorders: Secondary | ICD-10-CM

## 2015-09-08 DIAGNOSIS — F4010 Social phobia, unspecified: Secondary | ICD-10-CM

## 2015-09-08 LAB — TSH: TSH: 1.146 u[IU]/mL (ref 0.350–4.500)

## 2015-09-08 LAB — COMPREHENSIVE METABOLIC PANEL
ALK PHOS: 50 U/L (ref 40–115)
ALT: 18 U/L (ref 9–46)
AST: 20 U/L (ref 10–40)
Albumin: 4.3 g/dL (ref 3.6–5.1)
BUN: 8 mg/dL (ref 7–25)
CHLORIDE: 103 mmol/L (ref 98–110)
CO2: 28 mmol/L (ref 20–31)
Calcium: 9.4 mg/dL (ref 8.6–10.3)
Creat: 0.94 mg/dL (ref 0.60–1.35)
GLUCOSE: 78 mg/dL (ref 65–99)
POTASSIUM: 4 mmol/L (ref 3.5–5.3)
Sodium: 140 mmol/L (ref 135–146)
Total Bilirubin: 0.5 mg/dL (ref 0.2–1.2)
Total Protein: 7.1 g/dL (ref 6.1–8.1)

## 2015-09-08 LAB — CBC
HCT: 43.5 % (ref 39.0–52.0)
Hemoglobin: 14.9 g/dL (ref 13.0–17.0)
MCH: 28.1 pg (ref 26.0–34.0)
MCHC: 34.3 g/dL (ref 30.0–36.0)
MCV: 81.9 fL (ref 78.0–100.0)
MPV: 9.8 fL (ref 8.6–12.4)
PLATELETS: 190 10*3/uL (ref 150–400)
RBC: 5.31 MIL/uL (ref 4.22–5.81)
RDW: 13.4 % (ref 11.5–15.5)
WBC: 3.4 10*3/uL — AB (ref 4.0–10.5)

## 2015-09-08 LAB — LIPID PANEL
CHOL/HDL RATIO: 2.4 ratio (ref <=5.0)
Cholesterol: 147 mg/dL (ref 125–200)
HDL: 62 mg/dL (ref >=40)
LDL CALC: 74 mg/dL (ref <130)
Triglycerides: 54 mg/dL (ref <150)
VLDL: 11 mg/dL (ref <30)

## 2015-09-08 MED ORDER — CITALOPRAM HYDROBROMIDE 20 MG PO TABS: 20.0000 mg | ORAL_TABLET | Freq: Every day | ORAL | Status: DC

## 2015-09-08 MED ORDER — AMPHETAMINE-DEXTROAMPHETAMINE 20 MG PO TABS: 20.0000 mg | ORAL_TABLET | Freq: Two times a day (BID) | ORAL | Status: DC

## 2015-09-08 NOTE — Progress Notes (Signed)
Subjective:   HPI  Connor Barton is a 24 y.o. male who presents for a complete physical and other concerns  Concerns: ADD - needs refills on medication, medication seems to be working fine  Has concerns about anxiety, panic attacks.   Over the past year or so, he notes that he gets very anxious in large group settings.   Sometimes get anxious behind the wheel, even at recent orientation.  He is still full time in college and [employer], is working part time at [employer] and [employer].   He notes strong family hx/o anxiety and panic attacks.   Lately this is a daily struggle.  He has been seeing a Veterinary surgeon in [place], [place] where his mother lives, but so far her advice hasn't helped him overcome these panic episodes.    Has ongoing problems with right knee for over a year.  He has seen ortho for this, had MRI, has done PT, but nothing seems to help.  This pain limits him significantly.  No swelling, no paresthesias.   Reviewed their medical, surgical, family, social, medication, and allergy history and updated chart as appropriate.  Past Medical History  Diagnosis Date  . Seasonal allergies   . Wears glasses   . Scoliosis   . ADD (attention deficit disorder)   . Chronic pain of right knee     ortho eval and MRI 2015    Past Surgical History  Procedure Laterality Date  . No past surgeries  08/2015    Social History   Social History  . Marital Status: Single    Spouse Name: N/A  . Number of Children: N/A  . Years of Education: N/A   Occupational History  . Not on file.   Social History Main Topics  . Smoking status: Never Smoker   . Smokeless tobacco: Not on file  . Alcohol Use: 1.2 oz/week    2 Cans of beer per week  . Drug Use: No  . Sexual Activity: Not on file   Other Topics Concern  . Not on file   Social History Narrative   [employer] senior, degree in sports science and fitness management, graduates 11/2016.   Plans will be Runner, broadcasting/film/video.   Working at  [employer] at [employer], works as Education officer, museum at [employer].  Exercises regularly 3 times per week.  Has girlfriend of 2 years as of 08/2015.  Christian    Family History  Problem Relation Age of Onset  . Anxiety disorder Mother   . Hypertension Father   . Anxiety disorder Sister   . Anxiety disorder Brother   . Anxiety disorder Maternal Grandmother   . Healthy Paternal Grandmother   . Healthy Paternal Grandfather      Current outpatient prescriptions:  .  [START ON 11/08/2015] amphetamine-dextroamphetamine (ADDERALL) 20 MG tablet, Take 1 tablet (20 mg total) by mouth 2 (two) times daily., Disp: 60 tablet, Rfl: 0 .  citalopram (CELEXA) 20 MG tablet, Take 1 tablet (20 mg total) by mouth daily., Disp: 30 tablet, Rfl: 0  No Known Allergies   Review of Systems Constitutional: -fever, -chills, -sweats, -unexpected weight change, -decreased appetite, -fatigue Allergy: -sneezing, -itching, -congestion Dermatology: -changing moles, --rash, -lumps ENT: -runny nose, -ear pain, -sore throat, -hoarseness, -sinus pain, -teeth pain, - ringing in ears, -hearing loss, -nosebleeds Cardiology: -chest pain, -palpitations, -swelling, -difficulty breathing when lying flat, -waking up short of breath Respiratory: -cough, -shortness of  breath, -difficulty breathing with exercise or exertion, -wheezing, -coughing up blood Gastroenterology: -abdominal pain, -nausea, -vomiting, -diarrhea, -constipation, -blood in stool, -changes in bowel movement, -difficulty swallowing or eating Hematology: -bleeding, -bruising  Musculoskeletal: -muscle aches, -joint swelling, -back pain, -neck pain, -cramping, -changes in gait Ophthalmology: denies vision changes, eye redness, itching, discharge Urology: -burning with urination, -difficulty urinating, -blood in urine, -urinary frequency, -urgency, -incontinence Neurology: -headache, -weakness, -tingling, -numbness, -memory loss,  -falls, -dizziness Psychology: -depressed mood, -agitation, +sleep problems     Objective:   Physical Exam BP 122/90 mmHg  Pulse 80  Temp(Src) 98.3 F (36.8 C) (Oral)  Resp 18  Ht 6' 0.75" (1.848 m)  Wt 204 lb 3.2 oz (92.625 kg)  BMI 27.12 kg/m2  General appearance: alert, no distress, WD/WN, AA male Skin: few scattered macules, no worrisome lesions HEENT: normocephalic, conjunctiva/corneas normal, sclerae anicteric, PERRLA, EOMi, nares patent, no discharge or erythema, pharynx normal Oral cavity: MMM, tongue normal, teeth in good repair Neck: supple, no lymphadenopathy, no thyromegaly, no masses, normal ROM, no bruits Chest: non tender, normal shape and expansion Heart: RRR, normal S1, S2, no murmurs Lungs: CTA bilaterally, no wheezes, rhonchi, or rales Abdomen: +bs, soft, non tender, non distended, no masses, no hepatomegaly, no splenomegaly, no bruits Back: non tender, normal ROM Musculoskeletal: right knee with pain with flexion >60 degree, pain with squatting, unable to squat past 20 degrees without pain, otherwise no laxity, no swelling, rest of upper extremities non tender, no obvious deformity, normal ROM throughout, lower extremities non tender, no obvious deformity, normal ROM throughout Extremities: no edema, no cyanosis, no clubbing Pulses: 2+ symmetric, upper and lower extremities, normal cap refill Neurological: alert, oriented x 3, CN2-12 intact, strength normal upper extremities and lower extremities, sensation normal throughout, DTRs 2+ throughout, no cerebellar signs, gait normal Psychiatric: normal affect, behavior normal, pleasant  GU: normal male external genitalia, circumcised, nontender, no masses, no hernia, no lymphadenopathy Rectal: deferred   Assessment and Plan :   Encounter Diagnoses  Name Primary?  . Encounter for health maintenance examination in adult Yes  . ADD (attention deficit disorder)   . Family history of anxiety disorder   . Influenza  vaccination declined   . Chronic knee pain, right   . Social phobia   . Generalized anxiety disorder     Physical exam - discussed healthy lifestyle, diet, exercise, preventative care, vaccinations, and addressed their concerns.   See your eye doctor yearly for routine vision care. See your dentist yearly for routine dental care including hygiene visits twice yearly. ADD - doing ok on current medication, refills x 3 mo given and post dated today GAD, family hx/o anxiety - discussed anxiety, ways to cope, symptoms, treatment recommendations.   Advised he c/t counseling.  Begin trial of Citalopram, discussed risks/benefits of medication.  recheck 2-3 wk He declines influenza vaccine chronic knee pain - will get updated records and MRI from ortho.  Consider additional physical therapy or referral to different orthopedist for second opinion Follow-up pending labs, records

## 2015-09-22 ENCOUNTER — Encounter: Payer: Self-pay | Admitting: Medical

## 2016-02-04 ENCOUNTER — Other Ambulatory Visit: Payer: Self-pay | Admitting: Medical

## 2016-02-04 ENCOUNTER — Telehealth: Payer: Self-pay | Admitting: Medical

## 2016-02-04 MED ORDER — AMPHETAMINE-DEXTROAMPHETAMINE 20 MG PO TABS: 20.0000 mg | ORAL_TABLET | Freq: Two times a day (BID) | ORAL | Status: DC

## 2016-02-04 MED ORDER — AMPHETAMINE-DEXTROAMPHETAMINE 20 MG PO TABS
20.0000 mg | ORAL_TABLET | Freq: Two times a day (BID) | ORAL | Status: DC
Start: 2016-02-04 — End: 2016-04-22

## 2016-02-04 NOTE — Telephone Encounter (Signed)
Pt needs refill Adderall, he just found out he has to fly to [place] tomorrow for family emergency & needs to pick up today ASAP

## 2016-02-04 NOTE — Telephone Encounter (Signed)
#  3 Rx Adderall ready for pick up, Pt informed and advised need appt before runs out per [facility].

## 2016-04-22 ENCOUNTER — Ambulatory Visit (INDEPENDENT_AMBULATORY_CARE_PROVIDER_SITE_OTHER): Payer: BLUE CROSS/BLUE SHIELD | Admitting: Medical

## 2016-04-22 ENCOUNTER — Encounter: Payer: Self-pay | Admitting: Medical

## 2016-04-22 VITALS — BP 126/88 | HR 96 | Wt 202.0 lb

## 2016-04-22 DIAGNOSIS — F909 Attention-deficit hyperactivity disorder, unspecified type: Secondary | ICD-10-CM

## 2016-04-22 DIAGNOSIS — M79672 Pain in left foot: Secondary | ICD-10-CM | POA: Diagnosis not present

## 2016-04-22 DIAGNOSIS — S99912A Unspecified injury of left ankle, initial encounter: Secondary | ICD-10-CM

## 2016-04-22 DIAGNOSIS — M25572 Pain in left ankle and joints of left foot: Secondary | ICD-10-CM | POA: Diagnosis not present

## 2016-04-22 DIAGNOSIS — F988 Other specified behavioral and emotional disorders with onset usually occurring in childhood and adolescence: Secondary | ICD-10-CM

## 2016-04-22 MED ORDER — AMPHETAMINE-DEXTROAMPHETAMINE 20 MG PO TABS: 20.0000 mg | ORAL_TABLET | Freq: Two times a day (BID) | ORAL | Status: DC

## 2016-04-22 NOTE — Progress Notes (Signed)
Subjective: Chief Complaint  Patient presents with  . ADD    med refill. sprained his ankle back in feb. is still having trouble with it and would like it looked at if he could today.   Here for recheck on ADD.  In school at [employer] for sports science.   Still working at [employer].  taking Adderall  twice daily.  Plans after college - wants to pursue internship or position in sports related career such as Runner, broadcasting/film/video.  No issues with sleep or appetite on the medication.   medication seems to be working fine. No family hx/o heart disease.  ADD history:  He notes starting in middle school he began having problems with academics. He reports long history of problems focusing in school, problems with attention, having to go back over reading passages multiple times, simple distractions in the classroom can get his attention, has hard time focusing, zones out in his mind, and struggled through school. He notes that he did well enough on tests to pass, but his day to day grades didn't reflect how bad he was doing. He notes being taken to a counselor of some sort in downtown [place] in middle school. Went for about 6 weeks for help in organization, test taking, focus, but these efforts didn't make much of a difference. His mom chalked this up to him being lazy. He notes however, that he really did try to focus and do well. He says he wasn't being lazy, but just wasn't succeeding despite his efforts. He went to [employer] 2013 and did so bad he almost went on academic probation. Instead of offering any kind of assistance or strategies, they basically told him to consider other school options.   He notes injuring his ankle playing basketball in 01/2016.   Came down on left ankle with inversion injury.  Went to urgent care, was advised he had a sprain, given ankle brace, which he has been wearing daily.  still gets 6/10 pain, gets pains in medial and lateal ankle and achilles.    Was swollen initially,  had some purplish bruising.   No other aggravating or relieving factors. No other complaint.  ROS as in subjective  Past Medical History  Diagnosis Date  . Seasonal allergies   . Wears glasses   . Scoliosis   . ADD (attention deficit disorder)   . Chronic pain of right knee     ortho eval and MRI 2015   Past Surgical History  Procedure Laterality Date  . No past surgeries  08/2015     Objective: BP 126/88 mmHg  Pulse 96  Wt 202 lb (91.627 kg)  General appearance: alert, no distress, WD/WN Heart: RRR, normal S1, S2, no murmurs Lungs: CTA bilaterally, no wheezes, rhonchi, or rales Pulses: 2+ symmetric, upper and lower extremities, normal cap refill Ext: no edema Neuro: normal sensation, strength, DTRs. Left medial ankle tender over deltoid ligament, tender along medial anterior portion of calcaneus, tender over achilles insertion area, tender over ATFL.   No swelling, ROM of left ankle decreased in general compared to right, pain with left ankle ROM in general Right foot and ankle non tender with normal ROM    Assessment: Encounter Diagnoses  Name Primary?  . Left ankle pain Yes  . Left foot pain   . Ankle injury, left, initial encounter   . ADD (attention deficit disorder)     Plan:  ADD - c/t Adderall  BID, current doing fine with this.  Ankle pain - refer for xray and likely PT.     Connor Barton was seen today for add.  Diagnoses and all orders for this visit:  Left ankle pain -     DG Ankle Complete Left; Future  Left foot pain -     DG Ankle Complete Left; Future  Ankle injury, left, initial encounter -     DG Ankle Complete Left; Future  ADD (attention deficit disorder)  Other orders -     Discontinue: amphetamine-dextroamphetamine (ADDERALL) 20 MG tablet; Take 1 tablet (20 mg total) by mouth 2 (two) times daily. -     Discontinue: amphetamine-dextroamphetamine (ADDERALL) 20 MG tablet; Take 1 tablet (20 mg total) by mouth 2 (two) times daily. -      amphetamine-dextroamphetamine (ADDERALL) 20 MG tablet; Take 1 tablet (20 mg total) by mouth 2 (two) times daily.

## 2016-09-13 ENCOUNTER — Telehealth: Payer: Self-pay | Admitting: Medical

## 2016-09-13 NOTE — Telephone Encounter (Signed)
Pt called and made Med Ck appt for Thursday 9-21 however he said he explained that he misplaced/threw away his last Adderall script during his move and is down to 1 pill now. He wants to know if Vincenza HewsShane would give him one Adderall script today until he come in for his appt on Thursday. Call pt at [phone] when script is ready.

## 2016-09-14 ENCOUNTER — Other Ambulatory Visit: Payer: Self-pay | Admitting: Medical

## 2016-09-14 MED ORDER — AMPHETAMINE-DEXTROAMPHETAMINE 20 MG PO TABS: 20.0000 mg | ORAL_TABLET | Freq: Two times a day (BID) | ORAL | 0 refills | Status: DC

## 2016-09-14 NOTE — Telephone Encounter (Signed)
I'll sign rx

## 2016-09-14 NOTE — Telephone Encounter (Signed)
Per refill log, it doesn't look like he is using it every day, as last refill was 03/2016 with 3 month scripts given I believe.    In general this being a controlled substance, we don't normally refill these if lost, stolen, etc.  I can give him enough to last til Thursday's appt if he wants to do that, or just come in for appt and we will refill the normal scripts then

## 2016-09-14 NOTE — Telephone Encounter (Signed)
Pt called back and states that he would like the RX for the pills to last him to his appt informed him that his insurance may not cover them that he will have to pay out of pocket, would like to pick this up today please call [phone] when ready to pick up

## 2016-09-16 ENCOUNTER — Encounter: Payer: Self-pay | Admitting: Medical

## 2016-09-16 ENCOUNTER — Ambulatory Visit (INDEPENDENT_AMBULATORY_CARE_PROVIDER_SITE_OTHER): Payer: Self-pay | Admitting: Medical

## 2016-09-16 VITALS — BP 130/70 | HR 68 | Ht 73.0 in | Wt 206.2 lb

## 2016-09-16 DIAGNOSIS — F411 Generalized anxiety disorder: Secondary | ICD-10-CM

## 2016-09-16 DIAGNOSIS — F4010 Social phobia, unspecified: Secondary | ICD-10-CM

## 2016-09-16 DIAGNOSIS — F988 Other specified behavioral and emotional disorders with onset usually occurring in childhood and adolescence: Secondary | ICD-10-CM

## 2016-09-16 DIAGNOSIS — F909 Attention-deficit hyperactivity disorder, unspecified type: Secondary | ICD-10-CM

## 2016-09-16 DIAGNOSIS — Z818 Family history of other mental and behavioral disorders: Secondary | ICD-10-CM

## 2016-09-16 MED ORDER — AMPHETAMINE-DEXTROAMPHET ER 20 MG PO CP24: 20.0000 mg | ORAL_CAPSULE | Freq: Every day | ORAL | 0 refills | Status: DC

## 2016-09-16 MED ORDER — AMPHETAMINE-DEXTROAMPHETAMINE 20 MG PO TABS: 20.0000 mg | ORAL_TABLET | Freq: Two times a day (BID) | ORAL | 0 refills | Status: DC

## 2016-09-16 NOTE — Progress Notes (Signed)
Subjective: Chief Complaint  Patient presents with  . Follow-up   Here for recheck on ADD.  In school at [employer] for sports science.   Still working at 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  He is excited about this.  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    Moved close to the [employer].  taking Adderall 20mg  twice daily, doing fine on this but would like to change to XR version.  Has been on this in the past.  No issues with sleep or appetite on the medication.   medication seems to be working fine. No family hx/o heart disease.  Finishes last semester at [employer] in 11/2016.    No other c/o  ROS as in subjective  Past Medical History:  Diagnosis Date  . ADD (attention deficit disorder)   . Chronic pain of right knee    ortho eval and MRI 2015  . Scoliosis   . Seasonal allergies   . Wears glasses    Past Surgical History:  Procedure Laterality Date  . NO PAST SURGERIES  08/2015   No current outpatient prescriptions on file prior to visit.   No current facility-administered medications on file prior to visit.      Objective: Vitals:   09/16/16 0930  BP: 130/70  Pulse: 68    General appearance: alert, no distress, WD/WN Heart: RRR, normal S1, S2, no murmurs Lungs: CTA bilaterally, no wheezes, rhonchi, or rales Pulses: 2+ symmetric, upper and lower extremities, normal cap refill Ext: no edema Neuro: normal sensation, strength, DTRs.   Assessment: Encounter Diagnoses  Name Primary?  . ADD (attention deficit disorder) Yes  . Family history of anxiety disorder   . Generalized anxiety disorder   . Social phobia     Plan:  ADD - c/t Adderall 20mg  BID for one month, but as his new insurance kicks in, he will change to XR 20mg  daily.   Currently doing fine with Adderall.  Congratulated him on his new position full time.    In the past he had some issues with social phobia, anxiety but he  feels like this was situational related to prior stress and not really having any problems with this currently  Return 28mo

## 2016-09-22 ENCOUNTER — Encounter (HOSPITAL_COMMUNITY): Payer: Self-pay | Admitting: *Deleted

## 2016-09-22 ENCOUNTER — Ambulatory Visit (HOSPITAL_COMMUNITY)
Admission: EM | Admit: 2016-09-22 | Discharge: 2016-09-22 | Disposition: A | Discharge status: Home / Self Care | Payer: Worker's Compensation | Attending: Emergency Medicine | Admitting: Emergency Medicine

## 2016-09-22 ENCOUNTER — Ambulatory Visit (INDEPENDENT_AMBULATORY_CARE_PROVIDER_SITE_OTHER): Payer: Worker's Compensation

## 2016-09-22 DIAGNOSIS — S46001A Unspecified injury of muscle(s) and tendon(s) of the rotator cuff of right shoulder, initial encounter: Secondary | ICD-10-CM

## 2016-09-22 DIAGNOSIS — S4991XA Unspecified injury of right shoulder and upper arm, initial encounter: Secondary | ICD-10-CM | POA: Diagnosis not present

## 2016-09-22 MED ORDER — IBUPROFEN 800 MG PO TABS
800.0000 mg | ORAL_TABLET | Freq: Once | ORAL | Status: AC
  Administered 2016-09-22: 800 mg via ORAL

## 2016-09-22 MED ORDER — IBUPROFEN 800 MG PO TABS: 800.0000 mg | ORAL_TABLET | Freq: Three times a day (TID) | ORAL | 0 refills | Status: DC

## 2016-09-22 MED ORDER — HYDROCODONE-ACETAMINOPHEN 5-325 MG PO TABS: 2.0000 | ORAL_TABLET | ORAL | 0 refills | Status: DC | PRN

## 2016-09-22 MED ORDER — IBUPROFEN 800 MG PO TABS
ORAL_TABLET | ORAL | Status: AC
  Filled 2016-09-22: qty 1

## 2016-09-22 NOTE — ED Provider Notes (Signed)
HPI  SUBJECTIVE:  Connor Barton is a right handed 25 y.o. male who presents with R shoulder pain for the past for 5 days. Patient states that he was carrying a soccer goal above his head and it fell, torquing his shoulder backwards. He reports sharp, achy, deep right shoulder pain and soreness. States it is waking him up at night. He has tried rest, ice, Advil 400 mg, Biofreeze and icy hot. Symptoms are better with Biofreeze and icy hot, worse with reaching behind,  abduction past 90, and reaching across. He denies numbness, tingling, weakness of the grip or at the shoulder. No redness or swelling. He has no previous history of right shoulder injury, diabetes, hypertension, kidney disease.    Past Medical History:  Diagnosis Date  . ADD (attention deficit disorder)   . Chronic pain of right knee    ortho eval and MRI 2015  . Scoliosis   . Seasonal allergies   . Wears glasses     Past Surgical History:  Procedure Laterality Date  . NO PAST SURGERIES  08/2015    Family History  Problem Relation Age of Onset  . Anxiety disorder Mother   . Hypertension Father   . Anxiety disorder Sister   . Anxiety disorder Brother   . Anxiety disorder Maternal Grandmother   . Healthy Paternal Grandmother   . Healthy Paternal Grandfather     Social History  Substance Use Topics  . Smoking status: Never Smoker  . Smokeless tobacco: Not on file  . Alcohol use 1.2 oz/week    2 Cans of beer per week    No current facility-administered medications for this encounter.   Current Outpatient Prescriptions:  .  amphetamine-dextroamphetamine (ADDERALL XR) 20 MG 24 hr capsule, Take 1 capsule (20 mg total) by mouth daily., Disp: 30 capsule, Rfl: 0 .  amphetamine-dextroamphetamine (ADDERALL) 20 MG tablet, Take 1 tablet (20 mg total) by mouth 2 (two) times daily., Disp: 60 tablet, Rfl: 0 .  HYDROcodone-acetaminophen (NORCO/VICODIN) 5-325 MG tablet, Take 2 tablets by mouth every 4 (four) hours as  needed for moderate pain., Disp: 20 tablet, Rfl: 0 .  ibuprofen (ADVIL,MOTRIN) 800 MG tablet, Take 1 tablet (800 mg total) by mouth 3 (three) times daily., Disp: 30 tablet, Rfl: 0  Allergies  Allergen Reactions  . Codone [Hydrocodone] Anaphylaxis     ROS  As noted in HPI.   Physical Exam  BP 133/73 (BP Location: Left Arm)   Pulse 73   Temp 98.5 F (36.9 C) (Oral)   Resp 18   SpO2 100%   Constitutional: Well developed, well nourished, no acute distress Eyes:  EOMI, conjunctiva normal bilaterally HENT: Normocephalic, atraumatic,mucus membranes moist Respiratory: Normal inspiratory effort Cardiovascular: Normal rate GI: nondistended skin: No rash, skin intact Musculoskeletal: R with ROM somewhat limited, Drop test  painful but negative, unable to AB duct past 90, tenderness at the supraspinatus tendon, clavicle NT , A/C joint NT  scapula NT , proximal humerus NT , Motor strength normal , Sensation intact LT over entire arm,, grip strength 2+ and equal bilaterally. no pain with internal rotation, mild pain with external rotation,  negative tenderness in bicipital groove, positiveempty can test positive liftoff test, pain with, but no instability with abduction/external rotation. Neurologic: Alert & oriented x 3, no focal neuro deficits Psychiatric: Speech and behavior appropriate   ED Course   Medications  ibuprofen (ADVIL,MOTRIN) tablet 800 mg (800 mg Oral Given 09/22/16 1952)    Orders Placed This  Encounter  Procedures  . DG Shoulder Right    Standing Status:   Standing    Number of Occurrences:   1    Order Specific Question:   Reason for Exam (SYMPTOM  OR DIAGNOSIS REQUIRED)    Answer:   r/o fx avulsion fx dislocation    No results found for this or any previous visit (from the past 24 hour(s)). Dg Shoulder Right  Result Date: 09/22/2016 CLINICAL DATA:  25 year old male with injury to the right shoulder 4 days ago. Right shoulder pain. EXAM: RIGHT SHOULDER - 2+  VIEW COMPARISON:  None. FINDINGS: There is no evidence of fracture or dislocation. There is no evidence of arthropathy or other focal bone abnormality. Soft tissues are unremarkable. IMPRESSION: Negative. Electronically Signed   By: Trudie Reedaniel  Entrikin M.D.   On: 09/22/2016 20:00    ED Clinical Impression  Shoulder injury, right, initial encounter  Rotator cuff injury, right, initial encounter   ED Assessment/Plan Imaging independently reviewed. No fracture, dislocation. See radiology report for details.  Presentation consistent with rotator cuff injury. X-rays negative for any acute changes.   Patient states that he has tolerated Lortab elixir in the past without any problem. Went through medical record and repeatedly asked him what his allergy was cannot remember if it was to codeine or hydrocodone, however he states that he has tolerated Lortab in the past, so plan to send him home with norco, IBU 800, f/u with occupational health tomorrow for ongoing management and possible referral to orthopedics. We will write a note for light duty.  Discussed imaging MDM, plan and followup with patient.  Patient agrees with plan.   Meds ordered this encounter  Medications  . ibuprofen (ADVIL,MOTRIN) tablet 800 mg  . HYDROcodone-acetaminophen (NORCO/VICODIN) 5-325 MG tablet    Sig: Take 2 tablets by mouth every 4 (four) hours as needed for moderate pain.    Dispense:  20 tablet    Refill:  0  . ibuprofen (ADVIL,MOTRIN) 800 MG tablet    Sig: Take 1 tablet (800 mg total) by mouth 3 (three) times daily.    Dispense:  30 tablet    Refill:  0    *This clinic note was created using Scientist, clinical (histocompatibility and immunogenetics)Dragon dictation software. Therefore, there may be occasional mistakes despite careful proofreading.  ?   Domenick GongAshley Shatasha Lambing, MD 09/22/16 2208

## 2016-09-22 NOTE — Discharge Instructions (Signed)
Follow-up with occupational health tomorrow.  [address], [place], [place] [address] [phone]  Continue rest, ice. Take the ibuprofen with 1 g of Tylenol 3 times a day. Take the norco as needed. Do not take Tylenol and Norco sympathetic Tylenol in them. Each pill of Norco has 325 mg of Tylenol in it. Do not exceed 4 g of Tylenol from all sources in 24 hours.

## 2016-09-22 NOTE — ED Triage Notes (Signed)
Pt  Reports   Pain  r   Shoulder  X  5  Days  After  Carrying  Heavy  Objects   At  Work  5  Days  Ago     Pt  Appears in no  Acute  Distress      Pain on  Movement

## 2016-10-12 ENCOUNTER — Other Ambulatory Visit: Payer: Self-pay | Admitting: Specialist

## 2016-10-13 ENCOUNTER — Other Ambulatory Visit: Payer: Self-pay | Admitting: Orthopedic Surgery

## 2016-10-13 DIAGNOSIS — M25511 Pain in right shoulder: Secondary | ICD-10-CM

## 2016-10-26 ENCOUNTER — Ambulatory Visit
Admission: RE | Admit: 2016-10-26 | Discharge: 2016-10-26 | Disposition: A | Discharge status: Home / Self Care | Payer: Worker's Compensation | Source: Ambulatory Visit | Attending: Orthopedic Surgery | Admitting: Orthopedic Surgery

## 2016-10-26 DIAGNOSIS — M25511 Pain in right shoulder: Secondary | ICD-10-CM

## 2016-10-26 MED ORDER — IOPAMIDOL (ISOVUE-M 200) INJECTION 41%
15.0000 mL | Freq: Once | INTRAMUSCULAR | Status: AC
  Administered 2016-10-26: 15 mL via INTRA_ARTICULAR

## 2017-01-05 ENCOUNTER — Other Ambulatory Visit: Payer: Self-pay | Admitting: Medical

## 2017-01-05 ENCOUNTER — Telehealth: Payer: Self-pay | Admitting: Medical

## 2017-01-05 MED ORDER — AMPHETAMINE-DEXTROAMPHET ER 20 MG PO CP24
20.0000 mg | ORAL_CAPSULE | Freq: Every day | ORAL | 0 refills | Status: DC
Start: 2017-03-05 — End: 2017-04-15

## 2017-01-05 MED ORDER — AMPHETAMINE-DEXTROAMPHET ER 20 MG PO CP24
20.0000 mg | ORAL_CAPSULE | Freq: Every day | ORAL | 0 refills | Status: DC
Start: 2017-02-05 — End: 2017-01-05

## 2017-01-05 MED ORDER — AMPHETAMINE-DEXTROAMPHET ER 20 MG PO CP24: 20.0000 mg | ORAL_CAPSULE | Freq: Every day | ORAL | 0 refills | Status: DC

## 2017-01-05 NOTE — Telephone Encounter (Signed)
Pt called and informed rx is ready.

## 2017-01-05 NOTE — Telephone Encounter (Signed)
Pt called for refill of adderall. Please call [phone] when ready.

## 2017-01-05 NOTE — Telephone Encounter (Signed)
rx printed for the XR daily

## 2017-02-07 ENCOUNTER — Telehealth: Payer: Self-pay

## 2017-02-07 NOTE — Telephone Encounter (Signed)
Script for Adderall 20mg  dated 09/14/16 was not picked up. This script has been destroyed and placed in shred bin. Sending to you as Lorain ChildesFYI. Trixie Rude/RLB

## 2017-04-15 ENCOUNTER — Encounter: Payer: Self-pay | Admitting: Medical

## 2017-04-15 ENCOUNTER — Ambulatory Visit (INDEPENDENT_AMBULATORY_CARE_PROVIDER_SITE_OTHER): Payer: BLUE CROSS/BLUE SHIELD | Admitting: Medical

## 2017-04-15 VITALS — BP 124/88 | HR 93 | Wt 207.0 lb

## 2017-04-15 DIAGNOSIS — F988 Other specified behavioral and emotional disorders with onset usually occurring in childhood and adolescence: Secondary | ICD-10-CM | POA: Diagnosis not present

## 2017-04-15 DIAGNOSIS — R682 Dry mouth, unspecified: Secondary | ICD-10-CM | POA: Diagnosis not present

## 2017-04-15 MED ORDER — AMPHETAMINE-DEXTROAMPHET ER 30 MG PO CP24
30.0000 mg | ORAL_CAPSULE | Freq: Every day | ORAL | 0 refills | Status: DC
Start: 2017-05-15 — End: 2017-04-15

## 2017-04-15 MED ORDER — AMPHETAMINE-DEXTROAMPHET ER 30 MG PO CP24
30.0000 mg | ORAL_CAPSULE | Freq: Every day | ORAL | 0 refills | Status: DC
Start: 2017-06-15 — End: 2017-07-15

## 2017-04-15 MED ORDER — AMPHETAMINE-DEXTROAMPHET ER 30 MG PO CP24: 30.0000 mg | ORAL_CAPSULE | Freq: Every day | ORAL | 0 refills | Status: DC

## 2017-04-15 NOTE — Progress Notes (Signed)
Subjective: Chief Complaint  Patient presents with  . med check    med check, need a refill on adderall    Here for recheck on ADD.  Been doing ok in general.  Job is going great.  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    Currently in baseball and soccer seasons.   He notes that although the Adderal XR  works ok, on his longer days, 3 days per week, he wished the medication worked a little longer in the day.   Medication works fine in the mornings, but on his long days seems to wear off quicker.  Getting exercise.   No side effects.    Dry mouth at times.  Otherwise no other new concerns.     ROS as in subjective  Past Medical History:  Diagnosis Date  . ADD (attention deficit disorder)   . Chronic pain of right knee    ortho eval and MRI 2015  . Scoliosis   . Seasonal allergies   . Wears glasses    Past Surgical History:  Procedure Laterality Date  . NO PAST SURGERIES  08/2015   Current Outpatient Prescriptions on File Prior to Visit  Medication Sig Dispense Refill  . ibuprofen (ADVIL,MOTRIN) 800 MG tablet Take 1 tablet (800 mg total) by mouth 3 (three) times daily. 30 tablet 0   No current facility-administered medications on file prior to visit.      Objective: BP 124/88   Pulse 93   Wt 207 lb (93.9 kg)   SpO2 99%   BMI 27.31 kg/m   General appearance: alert, no distress, WD/WN Heart: RRR, normal S1, S2, no murmurs Lungs: CTA bilaterally, no wheezes, rhonchi, or rales Pulses: 2+ symmetric, upper and lower extremities, normal cap refill Ext: no edema Neuro: normal sensation, strength, DTRs.    Assessment: Encounter Diagnoses  Name Primary?  . Attention deficit disorder, unspecified hyperactivity presence Yes  . Dry mouth      Plan:  ADD - discussed current concerns, options for therapy.  Change to Adderall  XR.  C/t routine exercise, healthy diet, limit caffeine.   Plan to f/u by phone or email within a month.    Heman was seen today for med check.  Diagnoses and all orders for this visit:  Attention deficit disorder, unspecified hyperactivity presence  Dry mouth  Other orders -     Discontinue: amphetamine-dextroamphetamine (ADDERALL XR) 30 MG 24 hr capsule; Take 1 capsule (30 mg total) by mouth daily. -     Discontinue: amphetamine-dextroamphetamine (ADDERALL XR) 30 MG 24 hr capsule; Take 1 capsule (30 mg total) by mouth daily. -     amphetamine-dextroamphetamine (ADDERALL XR) 30 MG 24 hr capsule; Take 1 capsule (30 mg total) by mouth daily.

## 2017-07-14 ENCOUNTER — Telehealth: Payer: Self-pay | Admitting: Medical

## 2017-07-14 NOTE — Telephone Encounter (Signed)
Pt requesting refill on Adderall XR. Call [phone] when script is ready

## 2017-07-15 MED ORDER — AMPHETAMINE-DEXTROAMPHET ER 30 MG PO CP24: 30.0000 mg | ORAL_CAPSULE | ORAL | 0 refills | Status: DC

## 2017-07-15 MED ORDER — AMPHETAMINE-DEXTROAMPHET ER 30 MG PO CP24: 30.0000 mg | ORAL_CAPSULE | Freq: Every day | ORAL | 0 refills | Status: DC

## 2017-07-15 NOTE — Telephone Encounter (Signed)
Tammy, see if Dr. Susann GivensLalonde can write this script today since I'm not in the office.   Thanks

## 2017-07-15 NOTE — Telephone Encounter (Signed)
Can you write these scripts for the pt per Vincenza HewsShane?

## 2017-08-18 ENCOUNTER — Telehealth: Payer: Self-pay | Admitting: Family Medicine

## 2017-08-18 NOTE — Telephone Encounter (Signed)
Pt called end of day yesterday and states he lost the page that had 3 more scripts on it.  He had 4 on the sheet.  He will check with pharmacy and see if he gave them the entire sheet and call me back.  He would be out of meds today per pt.

## 2017-08-29 IMAGING — DX DG SHOULDER 2+V*R*
4 series · 4 of 4 positions shown · non-contrast
Comparison: None.

CLINICAL DATA: 24-year-old male with injury to the right shoulder 4
days ago. Right shoulder pain.

EXAM:
RIGHT SHOULDER - 2+ VIEW

[shoulder ap]
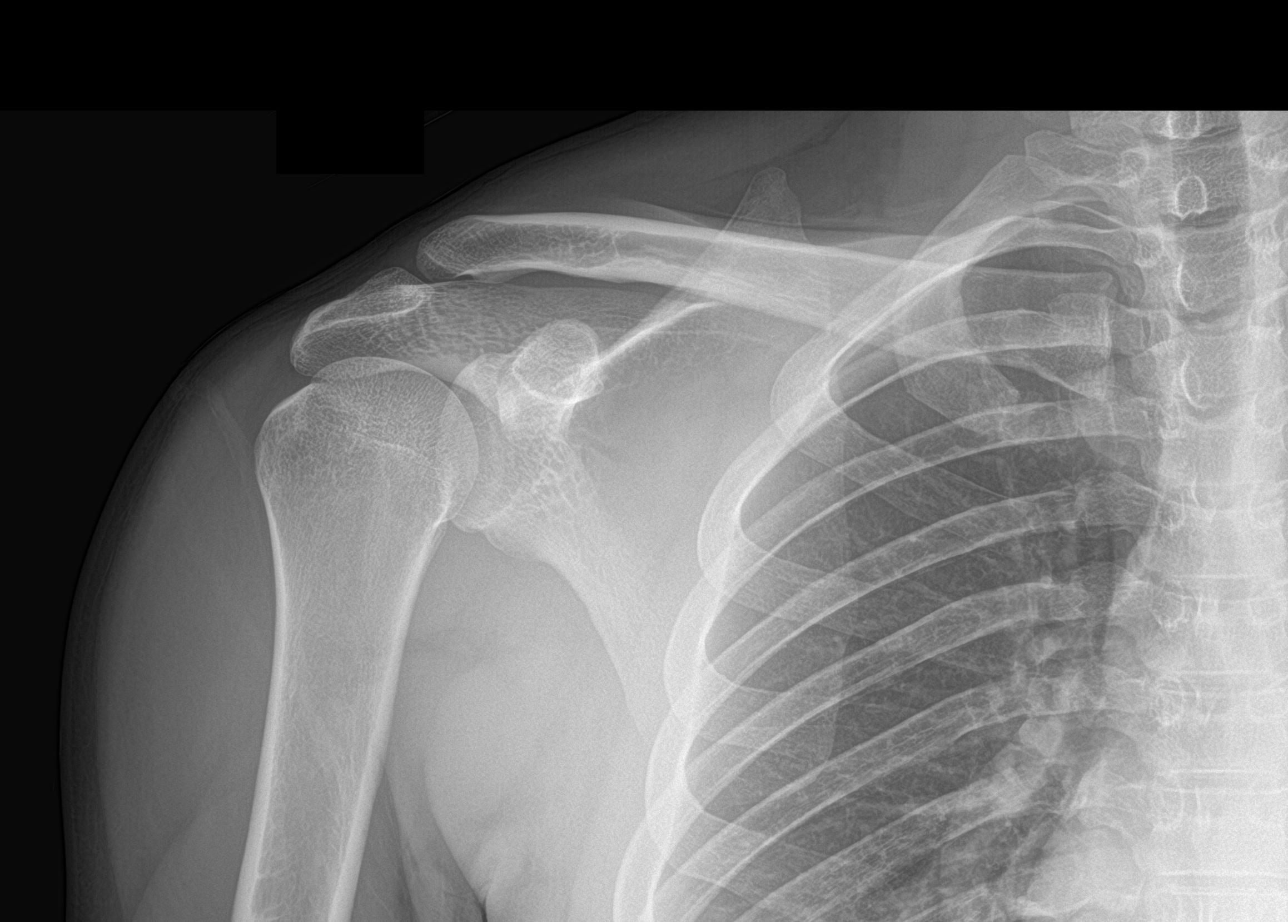

[shoulder grashey]
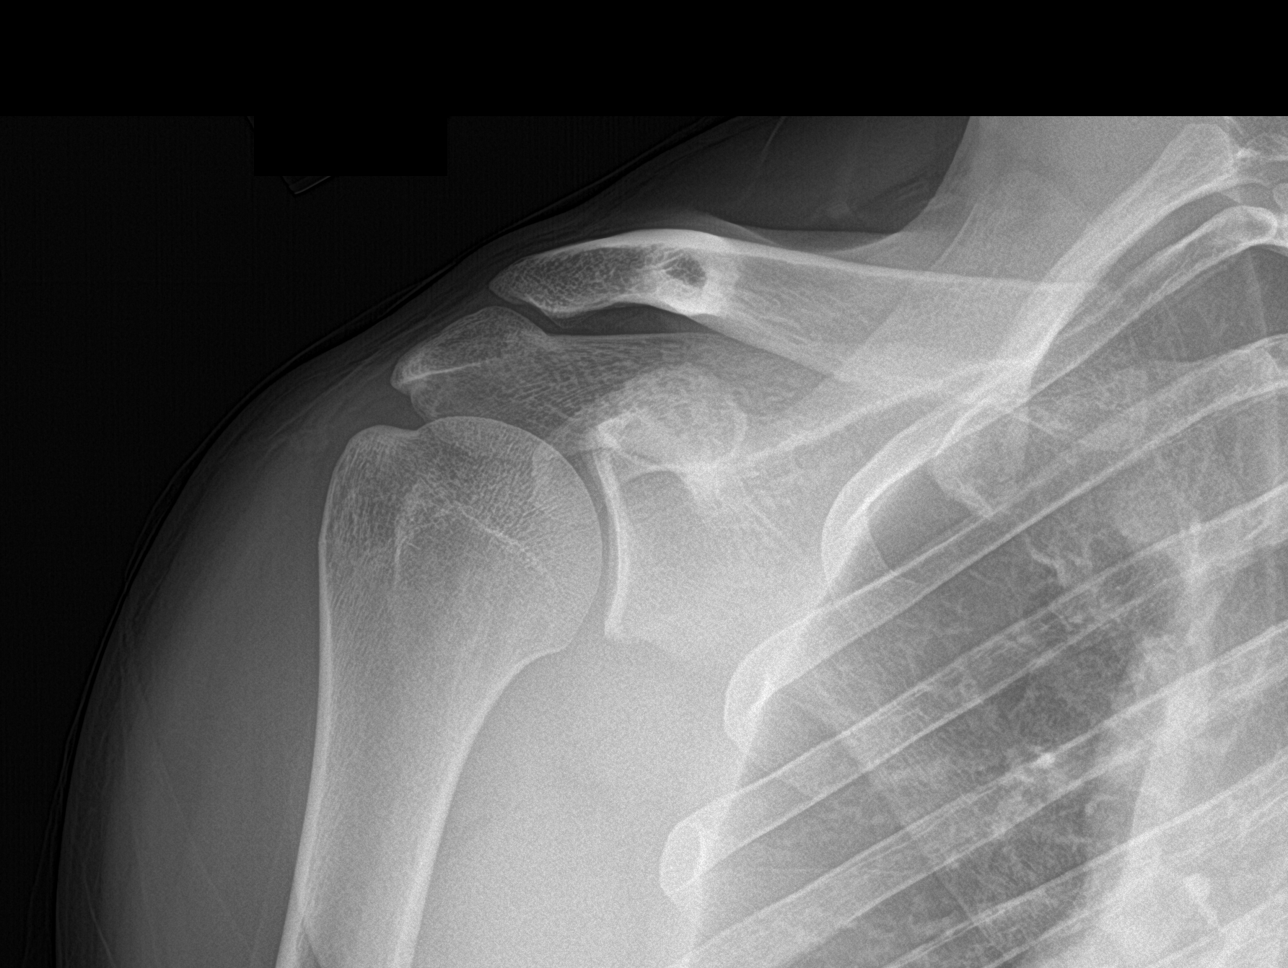

[shoulder y-view]
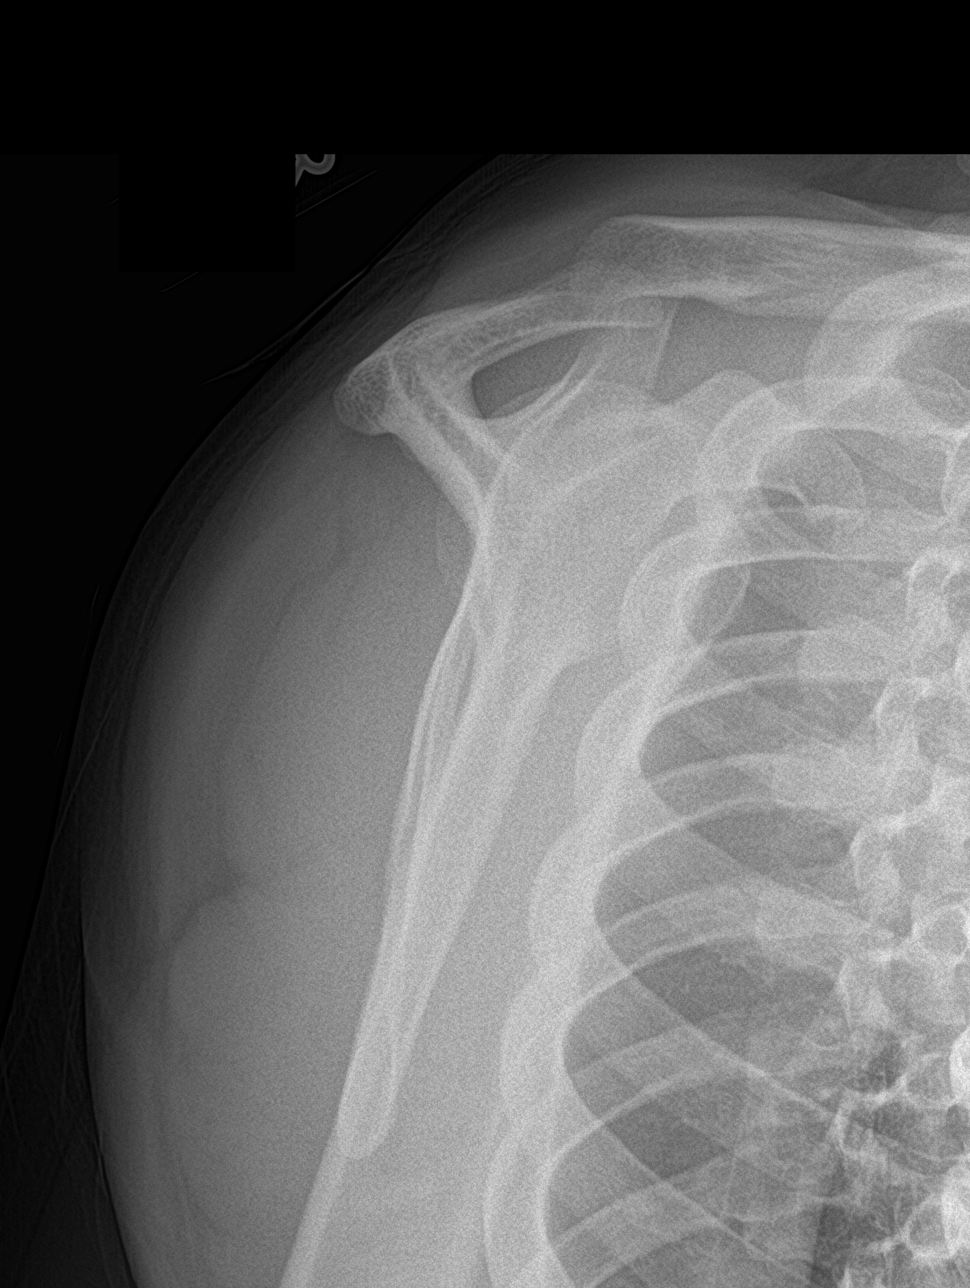

[shoulder axial]
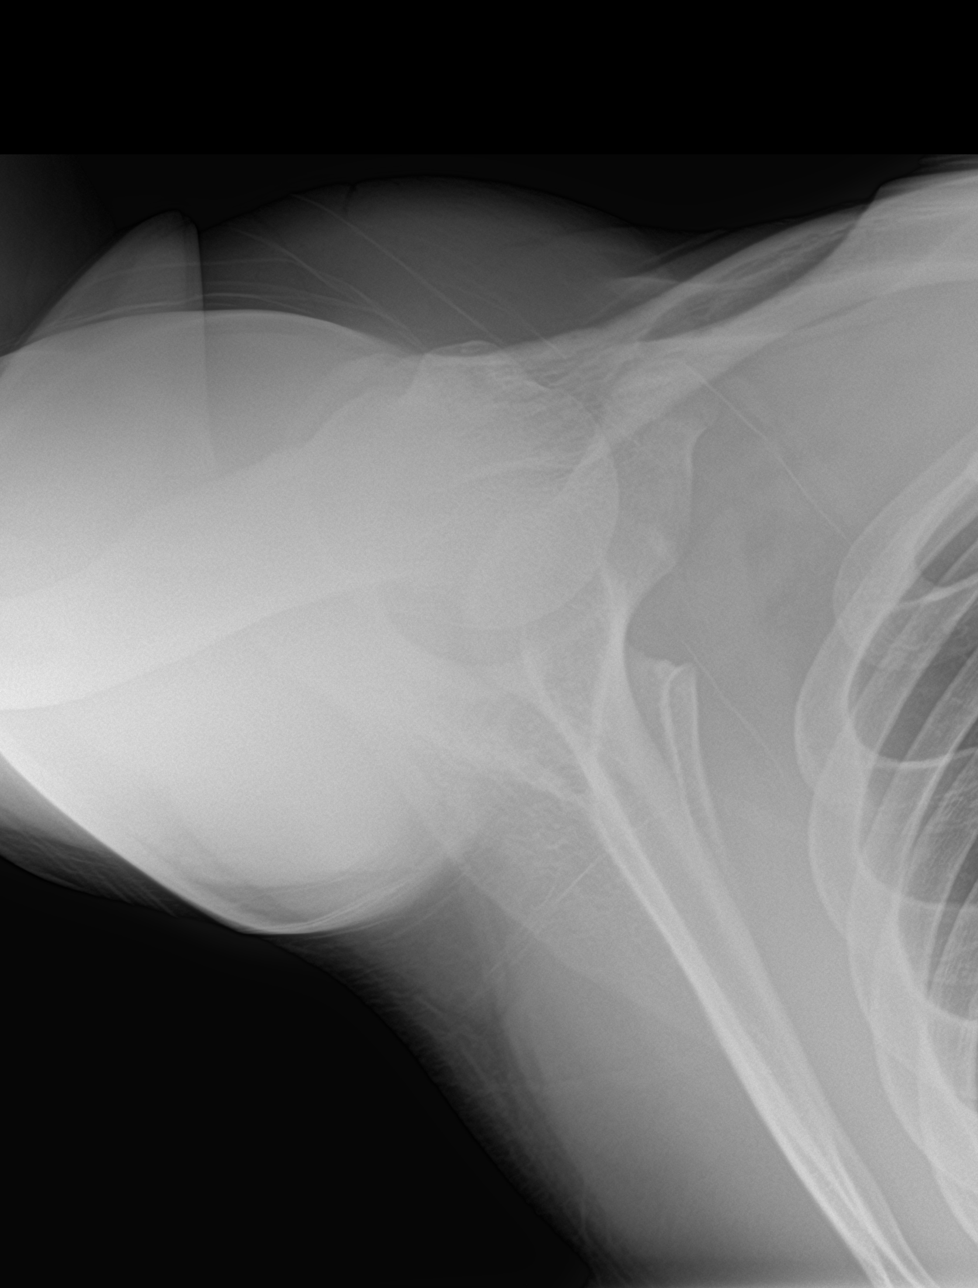

[4 of 4 positions shown; findings below may reference images not displayed]

FINDINGS: There is no evidence of fracture or dislocation. There is no
evidence of arthropathy or other focal bone abnormality. Soft
tissues are unremarkable.
IMPRESSION: Negative.

## 2017-09-14 ENCOUNTER — Other Ambulatory Visit: Payer: Self-pay | Admitting: Family Medicine

## 2017-10-13 ENCOUNTER — Telehealth: Payer: Self-pay | Admitting: Medical

## 2017-10-13 ENCOUNTER — Other Ambulatory Visit: Payer: Self-pay | Admitting: Medical

## 2017-10-13 MED ORDER — AMPHETAMINE-DEXTROAMPHET ER 30 MG PO CP24: 30.0000 mg | ORAL_CAPSULE | Freq: Every day | ORAL | 0 refills | Status: DC

## 2017-10-13 NOTE — Telephone Encounter (Signed)
Pt aware rx at front desk for pick up. Trixie Rude/RLB

## 2017-10-13 NOTE — Telephone Encounter (Signed)
Pt called and made an appt for Monday. He is almost out of medication. Please give pt a 30 day rx for Adderall XR. Pt can be reached at [phone].

## 2017-10-17 ENCOUNTER — Ambulatory Visit (INDEPENDENT_AMBULATORY_CARE_PROVIDER_SITE_OTHER): Payer: BLUE CROSS/BLUE SHIELD | Admitting: Medical

## 2017-10-17 VITALS — BP 132/88 | HR 92 | Wt 206.0 lb

## 2017-10-17 DIAGNOSIS — F988 Other specified behavioral and emotional disorders with onset usually occurring in childhood and adolescence: Secondary | ICD-10-CM | POA: Diagnosis not present

## 2017-10-17 DIAGNOSIS — G4700 Insomnia, unspecified: Secondary | ICD-10-CM

## 2017-10-17 DIAGNOSIS — Z8349 Family history of other endocrine, nutritional and metabolic diseases: Secondary | ICD-10-CM

## 2017-10-17 LAB — CBC
HEMATOCRIT: 42.5 % (ref 38.5–50.0)
HEMOGLOBIN: 14.2 g/dL (ref 13.2–17.1)
MCH: 27.7 pg (ref 27.0–33.0)
MCHC: 33.4 g/dL (ref 32.0–36.0)
MCV: 82.8 fL (ref 80.0–100.0)
MPV: 10.4 fL (ref 7.5–12.5)
Platelets: 203 10*3/uL (ref 140–400)
RBC: 5.13 10*6/uL (ref 4.20–5.80)
RDW: 12.2 % (ref 11.0–15.0)
WBC: 3.9 10*3/uL (ref 3.8–10.8)

## 2017-10-17 LAB — BASIC METABOLIC PANEL
BUN: 10 mg/dL (ref 7–25)
CHLORIDE: 106 mmol/L (ref 98–110)
CO2: 28 mmol/L (ref 20–32)
CREATININE: 1.03 mg/dL (ref 0.60–1.35)
Calcium: 9.4 mg/dL (ref 8.6–10.3)
Glucose, Bld: 87 mg/dL (ref 65–99)
POTASSIUM: 4.1 mmol/L (ref 3.5–5.3)
Sodium: 140 mmol/L (ref 135–146)

## 2017-10-17 LAB — TSH: TSH: 1.56 mIU/L (ref 0.40–4.50)

## 2017-10-17 MED ORDER — SUVOREXANT 10 MG PO TABS: 1.0000 | ORAL_TABLET | Freq: Every day | ORAL | 0 refills | Status: DC

## 2017-10-17 MED ORDER — AMPHETAMINE-DEXTROAMPHET ER 30 MG PO CP24: 30.0000 mg | ORAL_CAPSULE | Freq: Every day | ORAL | 0 refills | Status: DC

## 2017-10-17 MED ORDER — AMPHETAMINE-DEXTROAMPHET ER 30 MG PO CP24
30.0000 mg | ORAL_CAPSULE | Freq: Every day | ORAL | 0 refills | Status: DC
Start: 2017-11-17 — End: 2017-11-03

## 2017-10-17 MED ORDER — AMPHETAMINE-DEXTROAMPHET ER 30 MG PO CP24
30.0000 mg | ORAL_CAPSULE | ORAL | 0 refills | Status: DC
Start: 2017-10-17 — End: 2017-11-03

## 2017-10-17 NOTE — Progress Notes (Signed)
Subjective: Chief Complaint  Patient presents with  . med check    med check, having trouble falling and staying a sleep    Here for med check.  Here for f/u on ADD.  Doing fine on current medication for focus and attention.   Takes medication about 6am.   Work day starts about 6am, sometimes 9am.   Medication wears off about 4:30pm   This dose of 30mg  does better at Xr than prior 20mg  and better than prior regular release  He notes problems with sleep.   Has had sleep issues for a while.  Started taking zquil 3 years ago.   Mother has issues with sleep as well.   Mother doesn't have sleep apnea, had sleep study, and doesn't go in REM stage, is on Lunesta long term.   Has black out curtains.   Drinks a lot of water and lemonade.  No soda or caffeine.    If he doesn't take a zquil, can be awake 24 hours.  If he falls asleep naturally, will awake at 3am.   Past Medical History:  Diagnosis Date  . ADD (attention deficit disorder)   . Chronic pain of right knee    ortho eval and MRI 2015  . Scoliosis   . Seasonal allergies   . Wears glasses    Current Outpatient Prescriptions on File Prior to Visit  Medication Sig Dispense Refill  . ibuprofen (ADVIL,MOTRIN) 800 MG tablet Take 1 tablet (800 mg total) by mouth 3 (three) times daily. 30 tablet 0  . Multiple Vitamin (ONE-A-DAY MENS PO) Take by mouth.     No current facility-administered medications on file prior to visit.    ROS as in subjective   Objective BP 132/88   Pulse 92   Wt 206 lb (93.4 kg)   SpO2 99%   BMI 27.18 kg/m   Wt Readings from Last 3 Encounters:  10/17/17 206 lb (93.4 kg)  04/15/17 207 lb (93.9 kg)  09/16/16 206 lb 3.2 oz (93.5 kg)     General appearance: alert, no distress, WD/WN,  Neck: supple, no lymphadenopathy, no thyromegaly, no masses Heart: RRR, normal S1, S2, no murmurs Lungs: CTA bilaterally, no wheezes, rhonchi, or rales Extremities: no edema, no cyanosis, no clubbing Pulses: 2+ symmetric, upper  and lower extremities, normal cap refill Neurological: alert, oriented x 3, CN2-12 intact, strength normal upper extremities and lower extremities, sensation normal throughout, DTRs 2+ throughout, no cerebellar signs, gait normal Psychiatric: normal affect, behavior normal, pleasant     Assessment: Encounter Diagnoses  Name Primary?  . Attention deficit disorder, unspecified hyperactivity presence Yes  . Insomnia, unspecified type   . Family history of thyroid disease     Plan: ADD - does fine on current regimen.  Insomnia - discussed typical triggers and lifestyle issues that affect sleep.   He seems to be practicing good sleep hygiene measures.  Trial of belsomra.   Will likely refer to neurology for further eval given symptoms and mother's history.    Labs today as below  Ghali was seen today for med check.  Diagnoses and all orders for this visit:  Attention deficit disorder, unspecified hyperactivity presence -     TSH -     CBC -     Basic metabolic panel  Insomnia, unspecified type -     TSH -     CBC -     Basic metabolic panel  Family history of thyroid disease -  TSH -     CBC -     Basic metabolic panel  Other orders -     amphetamine-dextroamphetamine (ADDERALL XR) 30 MG 24 hr capsule; Take 1 capsule (30 mg total) by mouth daily. -     amphetamine-dextroamphetamine (ADDERALL XR) 30 MG 24 hr capsule; Take 1 capsule (30 mg total) by mouth daily. -     amphetamine-dextroamphetamine (ADDERALL XR) 30 MG 24 hr capsule; Take 1 capsule (30 mg total) by mouth every morning. -     Suvorexant (BELSOMRA) 10 MG TABS; Take 1 tablet by mouth at bedtime.

## 2017-10-19 DIAGNOSIS — G4700 Insomnia, unspecified: Secondary | ICD-10-CM | POA: Insufficient documentation

## 2017-10-19 NOTE — Addendum Note (Signed)
Addended by: Winn JockVALENTINE, Najai Waszak N on: 10/19/2017 02:49 PM   Modules accepted: Orders

## 2017-11-03 ENCOUNTER — Encounter: Payer: Self-pay | Admitting: Medical

## 2017-11-03 ENCOUNTER — Ambulatory Visit: Payer: BLUE CROSS/BLUE SHIELD | Admitting: Neurology

## 2017-11-03 ENCOUNTER — Encounter: Payer: Self-pay | Admitting: Neurology

## 2017-11-03 VITALS — BP 163/89 | HR 106 | Ht 73.0 in | Wt 206.0 lb

## 2017-11-03 DIAGNOSIS — G479 Sleep disorder, unspecified: Secondary | ICD-10-CM

## 2017-11-03 DIAGNOSIS — F419 Anxiety disorder, unspecified: Secondary | ICD-10-CM

## 2017-11-03 DIAGNOSIS — G2581 Restless legs syndrome: Secondary | ICD-10-CM

## 2017-11-03 DIAGNOSIS — Z820 Family history of epilepsy and other diseases of the nervous system: Secondary | ICD-10-CM | POA: Diagnosis not present

## 2017-11-03 DIAGNOSIS — E663 Overweight: Secondary | ICD-10-CM

## 2017-11-03 NOTE — Progress Notes (Signed)
Subjective:    Patient ID: Connor Barton is a 26 y.o. male.  HPI     Connor Foley, MD, PhD [facility] [address], [address] [address] [place], [place] [address]  Dear Connor Barton,   I saw your patient, Connor Barton, upon your kind request in my neurologic clinic today for initial consultation of his sleep disturbance, in particular, concern for underlying organic sleep disorder. The patient is unaccompanied today. As you know, [name] is a 26 year old right-handed gentleman with an underlying medical history of ADD, scoliosis, right knee pain, seasonal allergies, and mildly overweight state, who reports chronic difficulty with sleep onset and sleep maintenance, nonrestorative sleep. He is not sure if he snores. He lives with his girlfriend and his son. He takes Advil PM or Zquil to help him sleep at night. He has done this for 3-4 years. He recently tried belsomra, with what sounds like a three-day sample, he felt it worked well for him. Felt better in the morning then when he typically does after taking Advil PM orders equal. He felt like he fell asleep more naturally with the Belsomra. But did not like the way it made him feel, he felt too groggy on it. I reviewed your office note from 10/17/2017. He does have intermittent restless leg symptoms when trying to fall asleep at night. He has a 93-month-old son who sleeps in a bassinet next to them. He is a restless sleeper and tosses and turns. He does not have a night to night nocturia or morning headaches. He is not sure if he kicks in his sleep. His grandmother has a history of obstructive sleep apnea and had a CPAP machine. Both his parents have chronic sleep difficulties, mother takes Connor Barton, grandmother also takes Connor Barton, he reports that his father drinks alcohol at night to help him fall asleep. Patient does not drink alcohol on a regular basis, reports approximately 2 drinks per month. He is a nonsmoker and does  not utilize caffeine on a day-to-day basis. He drinks mostly water and lemonade. He is very health conscious. He is a Investment banker, operational at [employer]. He does not believe the Adderall interferes with his sleep. He had sleep difficulties even before he started the Adderall. He's been on it since 2013. Sleepiness score is 5 out of 24, fatigue score is 32 out of 63. He may have some underlying depression and anxiety he admits. He did try some 2 years ago, Celexa, but not for long, it looks like, maybe not even quite a month. Takes his Adderall XR 30 mg in AM, between 6-8 AM.  He takes 2 pills of Advil PM typically around 9 PM. He tries to wind down right before 9 PM, he does have a TV in the bedroom which is on a timer and utilizes the TV as a white noise machine. His wake up time varies depending on his work schedule.   His Past Medical History Is Significant For: Past Medical History:  Diagnosis Date  . ADD (attention deficit disorder)   . Chronic pain of right knee    ortho eval and MRI 2015  . Scoliosis   . Seasonal allergies   . Wears glasses     His Past Surgical History Is Significant For: Past Surgical History:  Procedure Laterality Date  . NO PAST SURGERIES  08/2015    His Family History Is Significant For: Family History  Problem Relation Age of Onset  . Anxiety disorder Mother   .  Hypertension Father   . Anxiety disorder Sister   . Anxiety disorder Brother   . Anxiety disorder Maternal Grandmother   . Healthy Paternal Grandmother   . Healthy Paternal Grandfather     His Social History Is Significant For: Social History   Socioeconomic History  . Marital status: Single    Spouse name: None  . Number of children: None  . Years of education: None  . Highest education level: None  Social Needs  . Financial resource strain: None  . Food insecurity - worry: None  . Food insecurity - inability: None  . Transportation needs - medical: None  . Transportation needs - non-medical:  None  Occupational History  . None  Tobacco Use  . Smoking status: Never Smoker  . Smokeless tobacco: Never Used  Substance and Sexual Activity  . Alcohol use: Yes    Alcohol/week: 1.2 oz    Types: 2 Cans of beer per week  . Drug use: No  . Sexual activity: None  Other Topics Concern  . None  Social History Narrative   [employer] senior, degree in sports science and fitness management, graduates 11/2016.   Plans will be Runner, broadcasting/film/videoathletic director.   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  Exercises regularly 3 times per week.  Has girlfriend of 2 years as of 08/2015.  Christian    His Allergies Are:  Allergies  Allergen Reactions  . Codone [Hydrocodone] Anaphylaxis  :   His Current Medications Are:  Outpatient Encounter Medications as of 11/03/2017  Medication Sig  . [START ON 12/17/2017] amphetamine-dextroamphetamine (ADDERALL XR) 30 MG 24 hr capsule Take 1 capsule (30 mg total) by mouth daily.  Marland Kitchen. ibuprofen (ADVIL,MOTRIN) 800 MG tablet Take 1 tablet (800 mg total) by mouth 3 (three) times daily.  . Multiple Vitamin (ONE-A-DAY MENS PO) Take by mouth.  . Suvorexant (BELSOMRA) 10 MG TABS Take 1 tablet by mouth at bedtime.  . [DISCONTINUED] amphetamine-dextroamphetamine (ADDERALL XR) 30 MG 24 hr capsule Take 1 capsule (30 mg total) by mouth daily.  . [DISCONTINUED] amphetamine-dextroamphetamine (ADDERALL XR) 30 MG 24 hr capsule Take 1 capsule (30 mg total) by mouth every morning.   No facility-administered encounter medications on file as of 11/03/2017.   :  Review of Systems:  Out of a complete 14 point review of systems, all are reviewed and negative with the exception of these symptoms as listed below:  Review of Systems  Neurological:       Pt presents today to discuss his insomnia. Pt does not think that he snores and has never had a sleep study. Pt takes advil PM and zquil to go to sleep. Pt has also tried  belsomra.  Epworth Sleepiness Scale 0= would never doze 1= slight chance of dozing 2= moderate chance of dozing 3= high chance of dozing  Sitting and reading: 0 Watching TV: 2 Sitting inactive in a public place (ex. Theater or meeting): 1 As a passenger in a car for an hour without a break: 1 Lying down to rest in the afternoon: 1 Sitting and talking to someone: 0 Sitting quietly after lunch (no alcohol): 0 In a car, while stopped in traffic: 0 Total: 5     Objective:  Neurological Exam  Physical Exam Physical Examination:   Vitals:   11/03/17 1315  BP: (!) 163/89  Pulse: (!) 106    General Examination: The patient is a very pleasant 25  y.o. male in no acute distress. He appears well-developed and well-nourished and well groomed. He is mildly anxious appearing.   HEENT: Normocephalic, atraumatic, pupils are equal, round and reactive to light and accommodation. He wears corrective eyeglasses. Extraocular tracking is good without limitation to gaze excursion or nystagmus noted. Normal smooth pursuit is noted. Hearing is grossly intact.  Face is symmetric with normal facial animation and normal facial sensation. Speech is clear with no dysarthria noted. There is no hypophonia. There is no lip, neck/head, jaw or voice tremor. Neck is supple with full range of passive and active motion. There are no carotid bruits on auscultation. Oropharynx exam reveals: mild mouth dryness, good dental hygiene and moderate airway crowding, due to larger tonsils of 3+ in size bilaterally and cryptic appearance of the tonsils is noted. He has a longer uvula and longer tongue. Mallampati is class I. Neck circumference is 16-1/4 inches. He wears braces.  Chest: Clear to auscultation without wheezing, rhonchi or crackles noted.  Heart: S1+S2+0, regular and normal without murmurs, rubs or gallops noted.   Abdomen: Soft, non-tender and non-distended with normal bowel sounds appreciated on  auscultation.  Extremities: There is no pitting edema in the distal lower extremities bilaterally. Pedal pulses are intact.  Skin: Warm and dry without trophic changes noted.  Musculoskeletal: exam reveals no obvious joint deformities, tenderness or joint swelling or erythema, mild scoliosis.   Neurologically:  Mental status: The patient is awake, alert and oriented in all 4 spheres. His immediate and remote memory, attention, language skills and fund of knowledge are appropriate. There is no evidence of aphasia, agnosia, apraxia or anomia. Speech is clear with normal prosody and enunciation. Thought process is linear. Mood is normal and affect is normal.  Cranial nerves Barton - XII are as described above under HEENT exam. In addition: shoulder shrug is normal with equal shoulder height noted. Motor exam: Normal bulk, strength and tone is noted. There is no drift, tremor or rebound. Romberg is negative. Reflexes are 2+ throughout.  Fine motor skills and coordination: intact with normal finger taps, normal hand movements, normal rapid alternating patting, normal foot taps and normal foot agility.  Cerebellar testing: No dysmetria or intention tremor on finger to nose testing. Heel to shin is unremarkable bilaterally. There is no truncal or gait ataxia.  Sensory exam: intact to light touch in the upper and lower extremities.  Gait, station and balance: He stands easily. No veering to one side is noted. No leaning to one side is noted. Posture is age-appropriate and stance is narrow based. Gait shows normal stride length and normal pace. No problems turning are noted. Tandem walk is unremarkable.  Assessment and Plan:  In summary, Aadil L Barton Barton is a very pleasant 26 y.o.-year old male  with an underlying medical history of ADD, scoliosis, right knee pain, seasonal allergies, and mildly overweight state, who presents for sleep consultation. He has a long-standing history of sleep difficulty. He has a  family history of obstructive sleep apnea. His airway examination does demonstrate a moderately crowded airway. His history is not telltale for sleep apnea but I think we should proceed with an attendant sleep study to rule out an underlying organic cause for his sleep difficulties. He had some success recently with a trial of Belsomra, and he is advised to talk to you about getting a prescription for this for as needed use. Ultimately, he may benefit from cognitive behavioral therapy for chronic insomnia. He also endorses some symptoms  of possible underlying mood disorder including depressive symptoms and anxiety and may benefit from another trial of an antidepressant as he did not try long enough on a prescription of Celexa as I understand in the past.  I had a long chat with the patient about my findings and the diagnosis of OSA, its prognosis and treatment options. We talked about medical treatments, surgical interventions and non-pharmacological approaches. I explained in particular the risks and ramifications of untreated moderate to severe OSA, especially with respect to developing cardiovascular disease down the Road, including congestive heart failure, difficult to treat hypertension, cardiac arrhythmias, or stroke. Even type 2 diabetes has, in part, been linked to untreated OSA. Symptoms of untreated OSA include daytime sleepiness, memory problems, mood irritability and mood disorder such as depression and anxiety, lack of energy, as well as recurrent headaches, especially morning headaches. We talked about trying to maintain a healthy lifestyle in general, as well as the importance of weight control. I encouraged the patient to eat healthy, exercise daily and keep well hydrated, to keep a scheduled bedtime and wake time routine, to not skip any meals and eat healthy snacks in between meals. I advised the patient not to drive when feeling sleepy. I recommended the following at this time: sleep study with  potential positive airway pressure titration. (We will score hypopneas at 3%).   I explained the sleep test procedure to the patient and also outlined possible surgical and non-surgical treatment options of OSA, including the use of a custom-made dental device (which would require a referral to a specialist dentist or oral surgeon), upper airway surgical options, such as pillar implants, radiofrequency surgery, tongue base surgery, and UPPP (which would involve a referral to an ENT surgeon). Rarely, jaw surgery such as mandibular advancement may be considered.  I also explained the CPAP treatment option to the patient, who indicated that he would be willing to try CPAP if the need arises. I explained the importance of being compliant with PAP treatment, not only for insurance purposes but primarily to improve His symptoms, and for the patient's long term health benefit, including to reduce His cardiovascular risks. I answered all his questions today and the patient was in agreement. I would like to see him back after the sleep study is completed and encouraged him to call with any interim questions, concerns, problems or updates.   Thank you very much for allowing me to participate in the care of this nice patient. If I can be of any further assistance to you please do not hesitate to call me at [phone].  Sincerely,   Connor FoleySaima Yakima Kreitzer, MD, PhD

## 2017-11-03 NOTE — Patient Instructions (Addendum)
Based on your symptoms and your exam I believe we should look for an underlying organic sleep disorder, such as obstructive sleep apnea/OSA, or dream enactments, or periodic leg movement disorder (PLMD), which is leg twitching in sleep. While this is typically associated with symptoms of restless leg syndrome, patients can have periodic leg movements, that is, repetitive twitching in their sleep secondary to medication effects, particularly from taking an SSRI type antidepressant. Sometimes changing the antidepressant regimen can help. If this leg movement disorder disrupts your sleep, it can cause other problems such as daytime sleepiness. Please do not drive if you feel sleepy. Sometimes other conditions can cause leg twitching at night including thyroid dysfunction and iron deficiency. There are some symptomatic medications available for treatment. At this point, we should proceed with a sleep study to further delineate your sleep-related issues.   If you have more than mild OSA, I want you to consider treatment with CPAP. Please remember, the risks and ramifications of moderate to severe obstructive sleep apnea or OSA are: Cardiovascular disease, including congestive heart failure, stroke, difficult to control hypertension, arrhythmias, and even type 2 diabetes has been linked to untreated OSA. Sleep apnea causes disruption of sleep and sleep deprivation in most cases, which, in turn, can cause recurrent headaches, problems with memory, mood, concentration, focus, and vigilance. Most people with untreated sleep apnea report excessive daytime sleepiness, which can affect their ability to drive. Please do not drive if you feel sleepy.   I will see you back after your sleep study to go over the test results and where to go from there. We will call you after your sleep study to advise about the results (most likely, you will hear from GraniteKristen, my nurse) and to set up an appointment at the time.    Ultimately,  for your chronic insomnia you may be best treated with cognitive behavioral therapy (CBT-I) through a psychologist.   Our sleep lab administrative assistant, Alvis LemmingsDawn will call you to schedule your sleep study. If you don't hear back from her by about 2 weeks from now, please feel free to call her at [phone]. This is her direct line and please leave a message with your phone number to call back if you get the voicemail box. She will call back as soon as possible.

## 2017-11-04 ENCOUNTER — Other Ambulatory Visit: Payer: Self-pay | Admitting: Medical

## 2017-11-04 ENCOUNTER — Telehealth: Payer: Self-pay | Admitting: Medical

## 2017-11-04 MED ORDER — SUVOREXANT 15 MG PO TABS: 1.0000 | ORAL_TABLET | Freq: Every day | ORAL | 2 refills | Status: DC

## 2017-11-04 MED ORDER — SUVOREXANT 10 MG PO TABS: 1.0000 | ORAL_TABLET | Freq: Every day | ORAL | 2 refills | Status: DC

## 2017-11-04 NOTE — Telephone Encounter (Signed)
Call out Belsomra 15mg  , not the 10mg .

## 2017-11-04 NOTE — Telephone Encounter (Signed)
Call in Belsomra with 2 refills.

## 2017-11-04 NOTE — Telephone Encounter (Signed)
Can pt have a refill on this 

## 2017-11-07 NOTE — Telephone Encounter (Signed)
Called in Belsomra. /RLB  

## 2017-11-08 ENCOUNTER — Telehealth: Payer: Self-pay

## 2017-11-08 DIAGNOSIS — G479 Sleep disorder, unspecified: Secondary | ICD-10-CM

## 2017-11-08 NOTE — Telephone Encounter (Signed)
[employer] denied in-lab study. Need HST order

## 2017-11-12 ENCOUNTER — Telehealth: Payer: Self-pay | Admitting: Medical

## 2017-11-12 NOTE — Telephone Encounter (Addendum)
Initaited Westley HummerP.A. BELSOMRA for Insomnia

## 2017-11-12 NOTE — Telephone Encounter (Deleted)
Initiated P.A. Belsomra

## 2017-11-14 ENCOUNTER — Other Ambulatory Visit: Payer: Self-pay | Admitting: Medical

## 2017-11-14 ENCOUNTER — Telehealth: Payer: Self-pay | Admitting: Medical

## 2017-11-14 MED ORDER — AMPHETAMINE-DEXTROAMPHET ER 30 MG PO CP24
30.0000 mg | ORAL_CAPSULE | Freq: Every day | ORAL | 0 refills | Status: DC
Start: 2017-11-14 — End: 2018-02-15

## 2017-11-14 MED ORDER — AMPHETAMINE-DEXTROAMPHET ER 30 MG PO CP24
30.0000 mg | ORAL_CAPSULE | Freq: Every day | ORAL | 0 refills | Status: DC
Start: 2018-01-14 — End: 2017-11-14

## 2017-11-14 MED ORDER — AMPHETAMINE-DEXTROAMPHET ER 30 MG PO CP24
30.0000 mg | ORAL_CAPSULE | Freq: Every day | ORAL | 0 refills | Status: DC
Start: 2018-01-14 — End: 2018-02-15

## 2017-11-14 MED ORDER — AMPHETAMINE-DEXTROAMPHET ER 30 MG PO CP24
30.0000 mg | ORAL_CAPSULE | Freq: Every day | ORAL | 0 refills | Status: DC
Start: 2018-11-14 — End: 2018-02-15

## 2017-11-14 MED ORDER — AMPHETAMINE-DEXTROAMPHET ER 30 MG PO CP24
30.0000 mg | ORAL_CAPSULE | Freq: Every day | ORAL | 0 refills | Status: DC
Start: 2017-12-14 — End: 2017-11-14

## 2017-11-14 MED ORDER — AMPHETAMINE-DEXTROAMPHET ER 30 MG PO CP24: 30.0000 mg | ORAL_CAPSULE | Freq: Every day | ORAL | 0 refills | Status: DC

## 2017-11-14 NOTE — Telephone Encounter (Signed)
Pt called and stated that the actually date on his adderall for October was 10/19. When he came in on the 22 his refills were date for the 22 of the month. He now has a RX dates 11/22 but is out of his medication. He is requesting the bring the rx's back and have them rewritten for the 19th of the month. Pt can be reached at [phone].

## 2017-11-14 NOTE — Telephone Encounter (Signed)
He needs to bring the 10/2017 and 11/2017 scripts back to exchange for the 2 I printed today.  He had called in 10/13/17 for refill when he was running out which threw off the dates.

## 2017-11-15 DIAGNOSIS — H5213 Myopia, bilateral: Secondary | ICD-10-CM | POA: Diagnosis not present

## 2017-11-30 NOTE — Telephone Encounter (Signed)
P.A. Marcellus ScottBelsomra Denied, Pt needs trial of at least one of the covered alternatives Zolpidem, Eszopiclone or Zaleplon.  Do you want to switch?

## 2017-12-01 ENCOUNTER — Other Ambulatory Visit: Payer: Self-pay | Admitting: Medical

## 2017-12-01 MED ORDER — ZOLPIDEM TARTRATE 10 MG PO TABS: 10.0000 mg | ORAL_TABLET | Freq: Every evening | ORAL | 0 refills | Status: DC | PRN

## 2017-12-01 NOTE — Telephone Encounter (Signed)
Please call out Ambien as trial since insurance wont cover Belsomra unless he has failed other medications.  He is suppose to have sleep study per neurology as well.

## 2017-12-09 NOTE — Telephone Encounter (Signed)
Called in Ambien, called pt and informed

## 2018-02-15 ENCOUNTER — Encounter: Payer: Self-pay | Admitting: Medical

## 2018-02-15 ENCOUNTER — Ambulatory Visit: Payer: BLUE CROSS/BLUE SHIELD | Admitting: Medical

## 2018-02-15 VITALS — BP 128/82 | HR 91 | Wt 210.6 lb

## 2018-02-15 DIAGNOSIS — G4700 Insomnia, unspecified: Secondary | ICD-10-CM

## 2018-02-15 DIAGNOSIS — F988 Other specified behavioral and emotional disorders with onset usually occurring in childhood and adolescence: Secondary | ICD-10-CM | POA: Diagnosis not present

## 2018-02-15 MED ORDER — ZOLPIDEM TARTRATE 10 MG PO TABS: 10.0000 mg | ORAL_TABLET | Freq: Every evening | ORAL | 0 refills | Status: DC | PRN

## 2018-02-15 MED ORDER — AMPHETAMINE-DEXTROAMPHETAMINE 30 MG PO TABS: 30.0000 mg | ORAL_TABLET | Freq: Two times a day (BID) | ORAL | 0 refills | Status: DC

## 2018-02-15 NOTE — Progress Notes (Signed)
Subjective: Chief Complaint  Patient presents with  . med check    med check , no other concerns    Here for f/u on ADD medication.    He recently took a job at [place] [employer] [place].   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  He wants to change his medication from XR to IR.   He doesn't need the full dose all day since his schedule can vary quite a bit.   He wants to be able to take once daily or less dose.   otherwise doing fine.  Ambien feels more natural than OTC tylenol PM. Uses prn.    Past Medical History:  Diagnosis Date  . ADD (attention deficit disorder)   . Chronic pain of right knee    ortho eval and MRI 2015  . Scoliosis   . Seasonal allergies   . Wears glasses    Current Outpatient Medications on File Prior to Visit  Medication Sig Dispense Refill  . Multiple Vitamin (ONE-A-DAY MENS PO) Take by mouth.     No current facility-administered medications on file prior to visit.    ROS as in subjective    Objective: BP 128/82   Pulse 91   Wt 210 lb 9.6 oz (95.5 kg)   SpO2 98%   BMI 27.79 kg/m   Wt Readings from Last 3 Encounters:  02/15/18 210 lb 9.6 oz (95.5 kg)  11/03/17 206 lb (93.4 kg)  10/17/17 206 lb (93.4 kg)   Gen: wd, wn, nad Psych: pleasant, good eye contact, answers questions appropriately    Assessment: Encounter Diagnoses  Name Primary?  . Attention deficit disorder, unspecified hyperactivity presence Yes  . Insomnia, unspecified type      Plan: adhd - change to Adderall 30 regular release with flexibility to use once daily or 1/2 -1 tablet prn.   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  He seems very excited about this.     Insomnia - c/t Ambien 1/2-1 tablet prn.   Counseled on sleep hygiene.   F/u 9mo  Connor Barton was seen today for med check.  Diagnoses and all orders for this  visit:  Attention deficit disorder, unspecified hyperactivity presence  Insomnia, unspecified type  Other orders -     amphetamine-dextroamphetamine (ADDERALL) 30 MG tablet; Take 1 tablet by mouth 2 (two) times daily. -     amphetamine-dextroamphetamine (ADDERALL) 30 MG tablet; Take 1 tablet by mouth 2 (two) times daily. -     amphetamine-dextroamphetamine (ADDERALL) 30 MG tablet; Take 1 tablet by mouth 2 (two) times daily. -     zolpidem (AMBIEN) 10 MG tablet; Take 1 tablet (10 mg total) by mouth at bedtime as needed for sleep.

## 2018-06-14 ENCOUNTER — Encounter: Payer: Self-pay | Admitting: Medical

## 2018-06-14 ENCOUNTER — Other Ambulatory Visit: Payer: Self-pay | Admitting: Medical

## 2018-06-14 MED ORDER — ZOLPIDEM TARTRATE 10 MG PO TABS: 10.0000 mg | ORAL_TABLET | Freq: Every evening | ORAL | 2 refills | Status: DC | PRN

## 2018-06-14 MED ORDER — AMPHETAMINE-DEXTROAMPHETAMINE 30 MG PO TABS: 30.0000 mg | ORAL_TABLET | Freq: Two times a day (BID) | ORAL | 0 refills | Status: DC

## 2018-07-31 ENCOUNTER — Telehealth: Payer: Self-pay | Admitting: Medical

## 2018-07-31 MED ORDER — AMPHETAMINE-DEXTROAMPHETAMINE 30 MG PO TABS: 30.0000 mg | ORAL_TABLET | Freq: Two times a day (BID) | ORAL | 0 refills | Status: DC

## 2018-07-31 NOTE — Telephone Encounter (Signed)
Pt called and made a medcheck appt with shane for next week. Needs refills on adderall and ambien until then. [facility] [place] and can be reached at [phone].

## 2018-07-31 NOTE — Telephone Encounter (Signed)
The Adderall was called in.  It does not look like he needs a refill his Ambien based on what I am looking at.

## 2018-08-01 ENCOUNTER — Other Ambulatory Visit: Payer: Self-pay

## 2018-08-01 MED ORDER — AMPHETAMINE-DEXTROAMPHETAMINE 30 MG PO TABS: 30.0000 mg | ORAL_TABLET | Freq: Two times a day (BID) | ORAL | 0 refills | Status: DC

## 2018-08-01 NOTE — Telephone Encounter (Signed)
Medication was confirmed with the pharmacy and patient has been notified that medication is at pharmacy.

## 2018-08-01 NOTE — Telephone Encounter (Signed)
Pt called to inform us that his Adderall went to the wrong pharmacy. I have called and canceled with the pharmacy in [place]. Please resend to the pharmacy that has been updated in the system.

## 2018-08-01 NOTE — Telephone Encounter (Signed)
Done KH 

## 2018-08-09 ENCOUNTER — Encounter: Payer: 59 | Admitting: Medical

## 2018-09-15 ENCOUNTER — Other Ambulatory Visit: Payer: Self-pay | Admitting: Medical

## 2018-09-15 ENCOUNTER — Ambulatory Visit: Payer: 59 | Admitting: Medical

## 2018-09-15 ENCOUNTER — Telehealth: Payer: Self-pay | Admitting: Medical

## 2018-09-15 ENCOUNTER — Encounter: Payer: Self-pay | Admitting: Medical

## 2018-09-15 VITALS — BP 140/86 | HR 84 | Temp 98.3°F | Resp 16 | Ht 73.0 in | Wt 204.0 lb

## 2018-09-15 DIAGNOSIS — G4700 Insomnia, unspecified: Secondary | ICD-10-CM

## 2018-09-15 DIAGNOSIS — F988 Other specified behavioral and emotional disorders with onset usually occurring in childhood and adolescence: Secondary | ICD-10-CM

## 2018-09-15 DIAGNOSIS — Z23 Encounter for immunization: Secondary | ICD-10-CM | POA: Diagnosis not present

## 2018-09-15 DIAGNOSIS — Z79899 Other long term (current) drug therapy: Secondary | ICD-10-CM | POA: Diagnosis not present

## 2018-09-15 DIAGNOSIS — F411 Generalized anxiety disorder: Secondary | ICD-10-CM

## 2018-09-15 MED ORDER — AMPHETAMINE-DEXTROAMPHETAMINE 30 MG PO TABS: 30.0000 mg | ORAL_TABLET | Freq: Two times a day (BID) | ORAL | 0 refills | Status: DC

## 2018-09-15 MED ORDER — ZOLPIDEM TARTRATE 10 MG PO TABS: 10.0000 mg | ORAL_TABLET | Freq: Every evening | ORAL | 2 refills | Status: DC | PRN

## 2018-09-15 NOTE — Telephone Encounter (Signed)
[facility]

## 2018-09-15 NOTE — Telephone Encounter (Signed)
   Pt called and [facility] does not have Adderall  He needs that one Adderall Rx sent to [facility]

## 2018-09-15 NOTE — Progress Notes (Signed)
Subjective: Chief Complaint  Patient presents with  . med check    med check    Here today with his toddler son for med check.  Doing well.  Living [facility] [place].  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   Feeling healthy.   medication working fine.   No adverse effects.    Gets free rent, lives on site, enjoying his time there.  Past Medical History:  Diagnosis Date  . ADD (attention deficit disorder)   . Chronic pain of right knee    ortho eval and MRI 2015  . Scoliosis   . Seasonal allergies   . Wears glasses     Current Outpatient Medications on File Prior to Visit  Medication Sig Dispense Refill  . Multiple Vitamin (ONE-A-DAY MENS PO) Take by mouth.     No current facility-administered medications on file prior to visit.    ROS as in subjective    Objective: BP 140/86   Pulse 84   Temp 98.3 F (36.8 C) (Oral)   Resp 16   Ht 6\' 1"  (1.854 m)   Wt 204 lb (92.5 kg)   SpO2 97%   BMI 26.91 kg/m   Wt Readings from Last 3 Encounters:  09/15/18 204 lb (92.5 kg)  02/15/18 210 lb 9.6 oz (95.5 kg)  11/03/17 206 lb (93.4 kg)   BP Readings from Last 3 Encounters:  09/15/18 140/86  02/15/18 128/82  11/03/17 (!) 163/89   General appearance: alert, no distress, WD/WN,  Oral cavity: MMM, no lesions Neck: supple, no lymphadenopathy, no thyromegaly, no masses Heart: RRR, normal S1, S2, no murmurs Lungs: CTA bilaterally, no wheezes, rhonchi, or rales Pulses: 2+ symmetric, upper and lower extremities, normal cap refill    Adult ECG Report  Indication: high risk medication  Rate: 74 bpm  Rhythm: normal sinus rhythm  QRS Axis: 82 degrees  PR Interval: 114ms  QRS Duration: 82ms  QTc: 428ms  Conduction Disturbances: none  Other Abnormalities: none  Patient's cardiac risk factors are: none.  EKG comparison: none  Narrative Interpretation: normal  EKG    Assessment: Encounter Diagnoses  Name Primary?  . Attention deficit disorder, unspecified hyperactivity presence Yes  . Generalized anxiety disorder   . Insomnia, unspecified type   . Need for influenza vaccination   . High risk medication use      Plan: Doing well on current regimen.  Refills given.  Glad to hear he is doing well.  Counseled on the influenza virus vaccine.  Vaccine information sheet given.  Influenza vaccine given after consent obtained.  We will request college vaccine records as the [place] record from the state is not complete.   He is up to date on Hep B series, and last Td 2012.      Connor Barton was seen today for med check.  Diagnoses and all orders for this visit:  Attention deficit disorder, unspecified hyperactivity presence -     EKG 12-Lead  Generalized anxiety disorder  Insomnia, unspecified type  Need for influenza vaccination  High risk medication use -     EKG 12-Lead  Other orders -     zolpidem (AMBIEN) 10 MG tablet; Take 1 tablet (10 mg total) by mouth at bedtime as needed for sleep. -     amphetamine-dextroamphetamine (ADDERALL) 30 MG tablet; Take 1 tablet by mouth 2 (two) times daily. -  amphetamine-dextroamphetamine (ADDERALL) 30 MG tablet; Take 1 tablet by mouth 2 (two) times daily. -     amphetamine-dextroamphetamine (ADDERALL) 30 MG tablet; Take 1 tablet by mouth 2 (two) times daily.

## 2018-09-29 ENCOUNTER — Telehealth: Payer: Self-pay | Admitting: Medical

## 2018-09-29 NOTE — Telephone Encounter (Signed)
Received requested immunizations from [facility]. Sending back for review.

## 2018-10-03 ENCOUNTER — Telehealth: Payer: Self-pay | Admitting: Medical

## 2018-10-03 NOTE — Telephone Encounter (Signed)
I received copy of vaccines.   He is up to date on mumps measles, rubella, Tdap, polio, Hepatitis B vaccines.

## 2018-10-06 NOTE — Telephone Encounter (Signed)
Left message on voicemail for patient to call back. 

## 2018-10-12 NOTE — Telephone Encounter (Signed)
Left detailed message about being up to date on vaccines

## 2018-10-27 ENCOUNTER — Other Ambulatory Visit: Payer: Self-pay | Admitting: Medical

## 2018-10-27 MED ORDER — AMPHETAMINE-DEXTROAMPHETAMINE 30 MG PO TABS: 30.0000 mg | ORAL_TABLET | Freq: Two times a day (BID) | ORAL | 0 refills | Status: DC

## 2018-12-25 ENCOUNTER — Other Ambulatory Visit: Payer: Self-pay

## 2018-12-25 MED ORDER — ZOLPIDEM TARTRATE 10 MG PO TABS: 10.0000 mg | ORAL_TABLET | Freq: Every evening | ORAL | 2 refills | Status: DC | PRN

## 2018-12-25 MED ORDER — AMPHETAMINE-DEXTROAMPHETAMINE 30 MG PO TABS: 30.0000 mg | ORAL_TABLET | Freq: Two times a day (BID) | ORAL | 0 refills | Status: DC

## 2019-01-26 ENCOUNTER — Ambulatory Visit (INDEPENDENT_AMBULATORY_CARE_PROVIDER_SITE_OTHER): Payer: BLUE CROSS/BLUE SHIELD | Admitting: Medical

## 2019-01-26 ENCOUNTER — Encounter: Payer: Self-pay | Admitting: Medical

## 2019-01-26 VITALS — BP 128/90 | HR 102 | Temp 98.2°F | Resp 16 | Ht 73.0 in | Wt 206.8 lb

## 2019-01-26 DIAGNOSIS — F988 Other specified behavioral and emotional disorders with onset usually occurring in childhood and adolescence: Secondary | ICD-10-CM

## 2019-01-26 DIAGNOSIS — M545 Low back pain, unspecified: Secondary | ICD-10-CM

## 2019-01-26 DIAGNOSIS — G4700 Insomnia, unspecified: Secondary | ICD-10-CM

## 2019-01-26 DIAGNOSIS — G8929 Other chronic pain: Secondary | ICD-10-CM | POA: Insufficient documentation

## 2019-01-26 DIAGNOSIS — Z79899 Other long term (current) drug therapy: Secondary | ICD-10-CM

## 2019-01-26 DIAGNOSIS — R030 Elevated blood-pressure reading, without diagnosis of hypertension: Secondary | ICD-10-CM

## 2019-01-26 DIAGNOSIS — F411 Generalized anxiety disorder: Secondary | ICD-10-CM | POA: Diagnosis not present

## 2019-01-26 MED ORDER — AMPHETAMINE-DEXTROAMPHETAMINE 30 MG PO TABS: 30.0000 mg | ORAL_TABLET | Freq: Two times a day (BID) | ORAL | 0 refills | Status: DC

## 2019-01-26 NOTE — Progress Notes (Signed)
Subjective: Chief Complaint  Patient presents with  . med check    med check    Here for routine med check.  ADD - doing well on current medication which helps him focus and stay on task.   No medication side effects reported  He notes some chronic back pains.  Lately he has been having some low back pain in general.  He is active, does exercises regularly, could be stretching more.  He does see a chiropractor periodically.  He has not had x-rays in the recent past years.  He denies numbness or tingling.  No falls in the weakness.  No fever.  No bowel or bladder changes.. Feels like he clenches his butt muscles at times.  Has a general soreness or tiredness of his body and back.  Regarding elevated pulse and blood pressure, he was rushing to get here today.  Past Medical History:  Diagnosis Date  . ADD (attention deficit disorder)   . Chronic pain of right knee    ortho eval and MRI 2015  . Scoliosis   . Seasonal allergies   . Wears glasses     Current Outpatient Medications on File Prior to Visit  Medication Sig Dispense Refill  . Multiple Vitamin (ONE-A-DAY MENS PO) Take by mouth.    . zolpidem (AMBIEN) 10 MG tablet Take 1 tablet (10 mg total) by mouth at bedtime as needed for sleep. 30 tablet 2   No current facility-administered medications on file prior to visit.    ROS as in subjective    Objective: BP 128/90   Pulse (!) 102   Temp 98.2 F (36.8 C) (Oral)   Resp 16   Ht 6\' 1"  (1.854 m)   Wt 206 lb 12.8 oz (93.8 kg)   SpO2 98%   BMI 27.28 kg/m   Wt Readings from Last 3 Encounters:  01/26/19 206 lb 12.8 oz (93.8 kg)  09/15/18 204 lb (92.5 kg)  02/15/18 210 lb 9.6 oz (95.5 kg)   BP Readings from Last 3 Encounters:  01/26/19 128/90  09/15/18 140/86  02/15/18 128/82   General appearance: alert, no distress, WD/WN,  Back non tender, ROM about 95% of normal, mild pain with back extension, no obvious deformity or curvature Legs nontender, normal ROM Neuro:  left leg strength slightly decreased compared to right, otherwise normal sensation, normal DTRs, normal heel and toe walk, - SLR Pulses: 2+ symmetric, upper and lower extremities, normal cap refill    Assessment: Encounter Diagnoses  Name Primary?  . Attention deficit disorder, unspecified hyperactivity presence Yes  . Generalized anxiety disorder   . High risk medication use   . Insomnia, unspecified type   . Chronic low back pain, unspecified back pain laterality, unspecified whether sciatica present   . Elevated blood-pressure reading without diagnosis of hypertension      Plan: Doing well on current regimen.  Refills given.  Glad to hear he is doing well. additional refills should be sent to where he is living in [place], [place]  Back pain - discuss concerns, core strengthen, regular stretching.   If he continues to have pain, we can pursue baseline xrays.   He declines xray today  Elevated BP without diagnosis of HTN - advised he monitor BPs for 2 weeks, then send me readings   Caynen was seen today for med check.  Diagnoses and all orders for this visit:  Attention deficit disorder, unspecified hyperactivity presence  Generalized anxiety disorder  High risk medication use  Insomnia, unspecified type  Chronic low back pain, unspecified back pain laterality, unspecified whether sciatica present  Elevated blood-pressure reading without diagnosis of hypertension  Other orders -     amphetamine-dextroamphetamine (ADDERALL) 30 MG tablet; Take 1 tablet by mouth 2 (two) times daily. -     amphetamine-dextroamphetamine (ADDERALL) 30 MG tablet; Take 1 tablet by mouth 2 (two) times daily. -     amphetamine-dextroamphetamine (ADDERALL) 30 MG tablet; Take 1 tablet by mouth 2 (two) times daily.   Next visit plan a physical

## 2019-01-26 NOTE — Progress Notes (Signed)
   Subjective:    Patient ID: Connor Barton, male    DOB: Nov 10, 1991, 28 y.o.   MRN: [id number]  HPI    Review of Systems     Objective:   Physical Exam        Assessment & Plan:

## 2019-01-26 NOTE — Patient Instructions (Signed)
Recommendations:  Stretch weekly, regularly  Look at [employer] or other Yoga routine on youtube you can do at home  Work on core strengthening such as rows, back extensions, dead lifts, ab crunches regularly  Hydrate well every day with water  Next visit lets plan a physical  Check your blood pressures a few times over the next few weeks  Limit salt and junk food   Goal BP is 120/70 Borderline is 130/80 High is >140/90

## 2019-02-09 DIAGNOSIS — W01198A Fall on same level from slipping, tripping and stumbling with subsequent striking against other object, initial encounter: Secondary | ICD-10-CM | POA: Diagnosis not present

## 2019-02-09 DIAGNOSIS — H53149 Visual discomfort, unspecified: Secondary | ICD-10-CM | POA: Diagnosis not present

## 2019-02-09 DIAGNOSIS — Z79899 Other long term (current) drug therapy: Secondary | ICD-10-CM | POA: Diagnosis not present

## 2019-02-09 DIAGNOSIS — Z87892 Personal history of anaphylaxis: Secondary | ICD-10-CM | POA: Diagnosis not present

## 2019-02-09 DIAGNOSIS — S060X0A Concussion without loss of consciousness, initial encounter: Secondary | ICD-10-CM | POA: Diagnosis not present

## 2019-02-09 DIAGNOSIS — Z885 Allergy status to narcotic agent status: Secondary | ICD-10-CM | POA: Diagnosis not present

## 2019-02-09 DIAGNOSIS — R112 Nausea with vomiting, unspecified: Secondary | ICD-10-CM | POA: Diagnosis not present

## 2019-02-09 DIAGNOSIS — S0990XA Unspecified injury of head, initial encounter: Secondary | ICD-10-CM | POA: Diagnosis not present

## 2019-03-01 ENCOUNTER — Telehealth: Payer: Self-pay

## 2019-03-01 NOTE — Telephone Encounter (Signed)
Patient has asked that his Ambien be sent to the pharmacy is [facility] because that is where he lives. Script has been transferred to requested pharmacy per patient.

## 2019-05-01 ENCOUNTER — Telehealth: Payer: Self-pay | Admitting: Medical

## 2019-05-01 ENCOUNTER — Other Ambulatory Visit: Payer: Self-pay | Admitting: Medical

## 2019-05-01 MED ORDER — AMPHETAMINE-DEXTROAMPHETAMINE 30 MG PO TABS: 30.0000 mg | ORAL_TABLET | Freq: Two times a day (BID) | ORAL | 0 refills | Status: DC

## 2019-05-01 MED ORDER — ZOLPIDEM TARTRATE 10 MG PO TABS: 10.0000 mg | ORAL_TABLET | Freq: Every evening | ORAL | 2 refills | Status: DC | PRN

## 2019-05-01 NOTE — Telephone Encounter (Signed)
Pt called for refills of adderall and ambien. [facility] [place]. Pt can be reached at [phone].

## 2019-05-01 NOTE — Telephone Encounter (Signed)
Pt called back & would like them sent to [facility] instead

## 2019-05-30 ENCOUNTER — Other Ambulatory Visit: Payer: Self-pay | Admitting: Medical

## 2019-05-30 MED ORDER — AMPHETAMINE-DEXTROAMPHETAMINE 30 MG PO TABS: 30.0000 mg | ORAL_TABLET | Freq: Two times a day (BID) | ORAL | 0 refills | Status: DC

## 2019-05-31 ENCOUNTER — Other Ambulatory Visit: Payer: Self-pay | Admitting: Medical

## 2019-05-31 MED ORDER — ZOLPIDEM TARTRATE 10 MG PO TABS: 10.0000 mg | ORAL_TABLET | Freq: Every evening | ORAL | 2 refills | Status: DC | PRN

## 2019-07-16 DIAGNOSIS — B349 Viral infection, unspecified: Secondary | ICD-10-CM | POA: Diagnosis not present

## 2019-07-16 DIAGNOSIS — Z1159 Encounter for screening for other viral diseases: Secondary | ICD-10-CM | POA: Diagnosis not present

## 2019-09-07 ENCOUNTER — Other Ambulatory Visit: Payer: Self-pay | Admitting: Medical

## 2019-09-07 MED ORDER — AMPHETAMINE-DEXTROAMPHETAMINE 30 MG PO TABS: 30.0000 mg | ORAL_TABLET | Freq: Two times a day (BID) | ORAL | 0 refills | Status: DC

## 2019-09-07 MED ORDER — ZOLPIDEM TARTRATE 10 MG PO TABS: 10.0000 mg | ORAL_TABLET | Freq: Every evening | ORAL | 0 refills | Status: DC | PRN

## 2019-09-07 MED ORDER — ZOLPIDEM TARTRATE 10 MG PO TABS: 10.0000 mg | ORAL_TABLET | Freq: Every evening | ORAL | 1 refills | Status: DC | PRN

## 2019-10-08 ENCOUNTER — Telehealth: Payer: Self-pay | Admitting: Family Medicine

## 2019-10-08 ENCOUNTER — Other Ambulatory Visit: Payer: Self-pay | Admitting: Medical

## 2019-10-08 MED ORDER — ZOLPIDEM TARTRATE 10 MG PO TABS: 10.0000 mg | ORAL_TABLET | Freq: Every evening | ORAL | 0 refills | Status: DC | PRN

## 2019-10-08 NOTE — Telephone Encounter (Signed)
[facility] Has scheduled CPE on 11/09/19

## 2019-10-28 HISTORY — PX: NO PAST SURGERIES: SHX2092

## 2019-11-07 ENCOUNTER — Other Ambulatory Visit: Payer: Self-pay | Admitting: Medical

## 2019-11-07 NOTE — Telephone Encounter (Signed)
[facility] Please advise [facility]

## 2019-11-09 ENCOUNTER — Encounter: Payer: Self-pay | Admitting: Medical

## 2019-11-09 ENCOUNTER — Other Ambulatory Visit: Payer: Self-pay

## 2019-11-09 ENCOUNTER — Ambulatory Visit: Payer: BLUE CROSS/BLUE SHIELD | Admitting: Medical

## 2019-11-09 VITALS — BP 120/80 | HR 97 | Temp 98.9°F | Ht 73.0 in | Wt 209.3 lb

## 2019-11-09 DIAGNOSIS — G4700 Insomnia, unspecified: Secondary | ICD-10-CM

## 2019-11-09 DIAGNOSIS — F411 Generalized anxiety disorder: Secondary | ICD-10-CM | POA: Diagnosis not present

## 2019-11-09 DIAGNOSIS — F988 Other specified behavioral and emotional disorders with onset usually occurring in childhood and adolescence: Secondary | ICD-10-CM

## 2019-11-09 DIAGNOSIS — Z23 Encounter for immunization: Secondary | ICD-10-CM

## 2019-11-09 DIAGNOSIS — Z0000 Encounter for general adult medical examination without abnormal findings: Secondary | ICD-10-CM

## 2019-11-09 DIAGNOSIS — Z79899 Other long term (current) drug therapy: Secondary | ICD-10-CM

## 2019-11-09 DIAGNOSIS — G8929 Other chronic pain: Secondary | ICD-10-CM

## 2019-11-09 DIAGNOSIS — M545 Low back pain: Secondary | ICD-10-CM

## 2019-11-09 NOTE — Progress Notes (Signed)
Subjective:   HPI  Connor Barton is a 28 y.o. male who presents for Chief Complaint  Patient presents with  . Annual Exam    with fasting labs     Patient Care Team: Shalin Vonbargen, Camelia Eng, PA-C as PCP - General (Family Medicine) Sees dentist Sees eye doctor  Concerns: No concerns, doing fine.    Still working at [employer] in [place], [place].  Doing fine, no covid exposures. His fiance and 2yo son lives with him. She works from home in Investment banker, corporate.   Wears fit bit showing avergae resting heart rate around 85 bpm  Insomnia - doesn't use Ambien every night.   Uses it on average 3 times per week.    adderall still works fine for focus and attention.   No complaints.     No recent back pains, joint pains.   Reviewed their medical, surgical, family, social, medication, and allergy history and updated chart as appropriate.  Past Medical History:  Diagnosis Date  . ADD (attention deficit disorder)   . Chronic back pain   . Chronic pain of right knee    ortho eval and MRI 2015  . Insomnia   . Scoliosis   . Seasonal allergies   . Wears glasses     Past Surgical History:  Procedure Laterality Date  . NO PAST SURGERIES  10/2019    Social History   Socioeconomic History  . Marital status: Single    Spouse name: Not on file  . Number of children: Not on file  . Years of education: Not on file  . Highest education level: Not on file  Occupational History  . Not on file  Social Needs  . Financial resource strain: Not on file  . Food insecurity    Worry: Not on file    Inability: Not on file  . Transportation needs    Medical: Not on file    Non-medical: Not on file  Tobacco Use  . Smoking status: Never Smoker  . Smokeless tobacco: Never Used  Substance and Sexual Activity  . Alcohol use: Yes    Alcohol/week: 2.0 standard drinks    Types: 2 Cans of beer per week  . Drug use: No  . Sexual activity: Not on file  Lifestyle  . Physical activity   Days per week: Not on file    Minutes per session: Not on file  . Stress: Not on file  Relationships  . Social Herbalist on phone: Not on file    Gets together: Not on file    Attends religious service: Not on file    Active member of club or organization: Not on file    Attends meetings of clubs or organizations: Not on file    Relationship status: Not on file  . Intimate partner violence    Fear of current or ex partner: Not on file    Emotionally abused: Not on file    Physically abused: Not on file    Forced sexual activity: Not on file  Other Topics Concern  . Not on file  Social History Narrative   Living at [facility] residential camp facility in [place], [place] close to [place].  Teaching with summer camps, group retreats.   Went to [employer].  Degree in sports science and fitness managemen.    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     Family History  Problem Relation  Age of Onset  . Anxiety disorder Mother   . Hypertension Father   . Anxiety disorder Sister   . Anxiety disorder Brother   . Anxiety disorder Maternal Grandmother   . Thyroid disease Maternal Grandmother   . Healthy Paternal Grandmother   . Healthy Paternal Grandfather   . Heart disease Neg Hx      Current Outpatient Medications:  .  amphetamine-dextroamphetamine (ADDERALL) 30 MG tablet, Take 1 tablet by mouth 2 (two) times daily., Disp: 60 tablet, Rfl: 0 .  Multiple Vitamin (ONE-A-DAY MENS PO), Take by mouth., Disp: , Rfl:  .  zolpidem (AMBIEN) 10 MG tablet, TAKE 1 TABLET (10 MG TOTAL) BY MOUTH AT BEDTIME AS NEEDED FOR SLEEP., Disp: 30 tablet, Rfl: 0 .  amphetamine-dextroamphetamine (ADDERALL) 30 MG tablet, Take 1 tablet by mouth 2 (two) times daily. (Patient not taking: Reported on 11/09/2019), Disp: 60 tablet, Rfl: 0 .  amphetamine-dextroamphetamine (ADDERALL) 30 MG tablet, Take 1 tablet by mouth 2 (two) times daily. (Patient not taking: Reported on 11/09/2019),  Disp: 60 tablet, Rfl: 0  Allergies  Allergen Reactions  . Codone [Hydrocodone] Anaphylaxis       Review of Systems Constitutional: -fever, -chills, -sweats, -unexpected weight change, -decreased appetite, -fatigue Allergy: -sneezing, -itching, -congestion Dermatology: -changing moles, --rash, -lumps ENT: -runny nose, -ear pain, -sore throat, -hoarseness, -sinus pain, -teeth pain, - ringing in ears, -hearing loss, -nosebleeds Cardiology: -chest pain, -palpitations, -swelling, -difficulty breathing when lying flat, -waking up short of breath Respiratory: -cough, -shortness of breath, -difficulty breathing with exercise or exertion, -wheezing, -coughing up blood Gastroenterology: -abdominal pain, -nausea, -vomiting, -diarrhea, -constipation, -blood in stool, -changes in bowel movement, -difficulty swallowing or eating Hematology: -bleeding, -bruising  Musculoskeletal: -joint aches, -muscle aches, -joint swelling, -back pain, -neck pain, -cramping, -changes in gait Ophthalmology: denies vision changes, eye redness, itching, discharge Urology: -burning with urination, -difficulty urinating, -blood in urine, -urinary frequency, -urgency, -incontinence Neurology: -headache, -weakness, -tingling, -numbness, -memory loss, -falls, -dizziness Psychology: -depressed mood, -agitation, -sleep problems Male GU: no testicular mass, pain, no lymph nodes swollen, no swelling, no rash.     Objective:  BP 120/80   Pulse 97   Temp 98.9 F (37.2 C)   Ht 6\' 1"  (1.854 m)   Wt 209 lb 4.8 oz (94.9 kg)   SpO2 98%   BMI 27.61 kg/m   General appearance: alert, no distress, WD/WN, African American male Skin: no worrisome lesions HEENT: normocephalic, conjunctiva/corneas normal, sclerae anicteric, PERRLA, EOMi, nares patent, no discharge or erythema, pharynx normal Oral cavity: MMM, tongue normal, teeth normal Neck: supple, no lymphadenopathy, no thyromegaly, no masses, normal ROM, no bruits Chest: non  tender, normal shape and expansion Heart: RRR, normal S1, S2, no murmurs Lungs: CTA bilaterally, no wheezes, rhonchi, or rales Abdomen: +bs, soft, non tender, non distended, no masses, no hepatomegaly, no splenomegaly, no bruits Back: non tender, normal ROM, no scoliosis Musculoskeletal: upper extremities non tender, no obvious deformity, normal ROM throughout, lower extremities non tender, no obvious deformity, normal ROM throughout Extremities: no edema, no cyanosis, no clubbing Pulses: 2+ symmetric, upper and lower extremities, normal cap refill Neurological: alert, oriented x 3, CN2-12 intact, strength normal upper extremities and lower extremities, sensation normal throughout, DTRs 2+ throughout, no cerebellar signs, gait normal Psychiatric: normal affect, behavior normal, pleasant  GU: normal male external genitalia,circumcised, nontender, no masses, no hernia, no lymphadenopathy Rectal: deferred   Assessment and Plan :   Encounter Diagnoses  Name Primary?  . Encounter for health maintenance examination  in adult Yes  . Attention deficit disorder, unspecified hyperactivity presence   . Generalized anxiety disorder   . Insomnia, unspecified type   . Need for influenza vaccination   . High risk medication use   . Chronic low back pain, unspecified back pain laterality, unspecified whether sciatica present     Physical exam - discussed and counseled on healthy lifestyle, diet, exercise, preventative care, vaccinations, sick and well care, proper use of emergency dept and after hours care, and addressed their concerns.    Health screening: See your eye doctor yearly for routine vision care. See your dentist yearly for routine dental care including hygiene visits twice yearly.  Cancer screening Advised monthly self testicular exam  Vaccinations: Advised yearly influenza vaccine Counseled on the influenza virus vaccine.  Vaccine information sheet given.  Influenza vaccine given  after consent obtained.  Up to date on Td vaccine.   Separate significant chronic issues discussed: ADD - counseled on risks/benefits of medication, proper use.  Stable on current therapy.  Reviewed EKG from 2019  insomnia - counseled on lifestyle, possible non medication ways to deal with getting to sleep.   Consider CBT therapy.   C/t Ambien prn.  He will let me know about possible referral for CBT.  Chronic low back pain - no recent issues.    Johneric was seen today for annual exam.  Diagnoses and all orders for this visit:  Encounter for health maintenance examination in adult -     Comprehensive metabolic panel -     CBC with Differential -     Lipid Panel -     TSH regular  Attention deficit disorder, unspecified hyperactivity presence  Generalized anxiety disorder  Insomnia, unspecified type  Need for influenza vaccination  High risk medication use  Chronic low back pain, unspecified back pain laterality, unspecified whether sciatica present   Follow-up pending labs, yearly for physical

## 2019-11-09 NOTE — Addendum Note (Signed)
Addended by: Edgar Frisk on: 11/09/2019 09:44 AM   Modules accepted: Orders

## 2019-11-10 LAB — COMPREHENSIVE METABOLIC PANEL
ALT: 28 IU/L (ref 0–44)
AST: 23 IU/L (ref 0–40)
Albumin/Globulin Ratio: 1.7 (ref 1.2–2.2)
Albumin: 4.5 g/dL (ref 4.1–5.2)
Alkaline Phosphatase: 69 IU/L (ref 39–117)
BUN/Creatinine Ratio: 6 — ABNORMAL LOW (ref 9–20)
BUN: 6 mg/dL (ref 6–20)
Bilirubin Total: 0.3 mg/dL (ref 0.0–1.2)
CO2: 26 mmol/L (ref 20–29)
Calcium: 9.3 mg/dL (ref 8.7–10.2)
Chloride: 103 mmol/L (ref 96–106)
Creatinine, Ser: 1.02 mg/dL (ref 0.76–1.27)
GFR calc Af Amer: 117 mL/min/{1.73_m2} (ref >59)
GFR calc non Af Amer: 101 mL/min/{1.73_m2} (ref >59)
Globulin, Total: 2.6 g/dL (ref 1.5–4.5)
Glucose: 93 mg/dL (ref 65–99)
Potassium: 4.4 mmol/L (ref 3.5–5.2)
Sodium: 140 mmol/L (ref 134–144)
Total Protein: 7.1 g/dL (ref 6.0–8.5)

## 2019-11-10 LAB — CBC WITH DIFFERENTIAL/PLATELET
Basophils Absolute: 0 10*3/uL (ref 0.0–0.2)
Basos: 1 %
EOS (ABSOLUTE): 0 10*3/uL (ref 0.0–0.4)
Eos: 1 %
Hematocrit: 44.2 % (ref 37.5–51.0)
Hemoglobin: 15.1 g/dL (ref 13.0–17.7)
Immature Grans (Abs): 0 10*3/uL (ref 0.0–0.1)
Immature Granulocytes: 0 %
Lymphocytes Absolute: 1.8 10*3/uL (ref 0.7–3.1)
Lymphs: 57 %
MCH: 28.3 pg (ref 26.6–33.0)
MCHC: 34.2 g/dL (ref 31.5–35.7)
MCV: 83 fL (ref 79–97)
Monocytes Absolute: 0.3 10*3/uL (ref 0.1–0.9)
Monocytes: 10 %
Neutrophils Absolute: 1 10*3/uL — ABNORMAL LOW (ref 1.4–7.0)
Neutrophils: 31 %
Platelets: 202 10*3/uL (ref 150–450)
RBC: 5.33 x10E6/uL (ref 4.14–5.80)
RDW: 12.6 % (ref 11.6–15.4)
WBC: 3.2 10*3/uL — ABNORMAL LOW (ref 3.4–10.8)

## 2019-11-10 LAB — LIPID PANEL
Chol/HDL Ratio: 2.1 ratio (ref 0.0–5.0)
Cholesterol, Total: 155 mg/dL (ref 100–199)
HDL: 75 mg/dL (ref >39)
LDL Chol Calc (NIH): 70 mg/dL (ref 0–99)
Triglycerides: 47 mg/dL (ref 0–149)
VLDL Cholesterol Cal: 10 mg/dL (ref 5–40)

## 2019-11-10 LAB — TSH: TSH: 1.44 u[IU]/mL (ref 0.450–4.500)

## 2019-12-10 ENCOUNTER — Other Ambulatory Visit: Payer: Self-pay | Admitting: Medical

## 2019-12-10 ENCOUNTER — Other Ambulatory Visit: Payer: Self-pay

## 2019-12-10 MED ORDER — AMPHETAMINE-DEXTROAMPHETAMINE 30 MG PO TABS: 30.0000 mg | ORAL_TABLET | Freq: Two times a day (BID) | ORAL | 0 refills | Status: AC

## 2019-12-10 MED ORDER — AMPHETAMINE-DEXTROAMPHETAMINE 30 MG PO TABS: 30.0000 mg | ORAL_TABLET | Freq: Two times a day (BID) | ORAL | 0 refills | Status: DC

## 2019-12-10 MED ORDER — ZOLPIDEM TARTRATE 10 MG PO TABS: 10.0000 mg | ORAL_TABLET | Freq: Every evening | ORAL | 1 refills | Status: DC | PRN

## 2020-01-11 ENCOUNTER — Other Ambulatory Visit: Payer: Self-pay | Admitting: Medical

## 2020-01-11 MED ORDER — ZOLPIDEM TARTRATE 10 MG PO TABS: 10.0000 mg | ORAL_TABLET | Freq: Every evening | ORAL | 0 refills | Status: DC | PRN

## 2020-01-11 MED ORDER — AMPHETAMINE-DEXTROAMPHETAMINE 30 MG PO TABS: 30.0000 mg | ORAL_TABLET | Freq: Two times a day (BID) | ORAL | 0 refills | Status: DC

## 2020-02-18 ENCOUNTER — Other Ambulatory Visit: Payer: Self-pay

## 2020-02-18 MED ORDER — ZOLPIDEM TARTRATE 10 MG PO TABS: 10.0000 mg | ORAL_TABLET | Freq: Every evening | ORAL | 0 refills | Status: DC | PRN

## 2020-02-18 MED ORDER — AMPHETAMINE-DEXTROAMPHETAMINE 30 MG PO TABS: 30.0000 mg | ORAL_TABLET | Freq: Two times a day (BID) | ORAL | 0 refills | Status: DC

## 2020-04-02 ENCOUNTER — Other Ambulatory Visit: Payer: Self-pay

## 2020-04-03 MED ORDER — AMPHETAMINE-DEXTROAMPHETAMINE 30 MG PO TABS: 30.0000 mg | ORAL_TABLET | Freq: Two times a day (BID) | ORAL | 0 refills | Status: DC

## 2020-04-03 MED ORDER — ZOLPIDEM TARTRATE 10 MG PO TABS: 10.0000 mg | ORAL_TABLET | Freq: Every evening | ORAL | 0 refills | Status: DC | PRN

## 2020-05-08 ENCOUNTER — Other Ambulatory Visit: Payer: Self-pay

## 2020-05-08 NOTE — Telephone Encounter (Signed)
Please verify which pharmacy he needs this sent to.  I thought the [place] pharmacy was a one time thing.  I may not get to this right away tomorrow as I will be away from my computer.  If this is urgent or if he will be somewhere different tomorrow or Saturday, have another provider send it appropriately

## 2020-05-12 MED ORDER — AMPHETAMINE-DEXTROAMPHETAMINE 30 MG PO TABS: 30.0000 mg | ORAL_TABLET | Freq: Two times a day (BID) | ORAL | 0 refills | Status: AC

## 2020-05-12 MED ORDER — ZOLPIDEM TARTRATE 10 MG PO TABS: 10.0000 mg | ORAL_TABLET | Freq: Every evening | ORAL | 0 refills | Status: AC | PRN

## 2020-05-12 NOTE — Telephone Encounter (Signed)
I called the pt. And verified that he wanted them sent to that pharmacy in [place] and he said that was correct pharmacy if you could send them there please.

## 2020-11-14 ENCOUNTER — Telehealth: Payer: Self-pay | Admitting: Medical

## 2020-11-14 ENCOUNTER — Encounter: Payer: BLUE CROSS/BLUE SHIELD | Admitting: Medical

## 2020-11-14 NOTE — Telephone Encounter (Signed)
Forwarding

## 2020-11-14 NOTE — Telephone Encounter (Signed)
This patient no showed for their appointment today.Which of the following is necessary for this patient.   A) No follow-up necessary   B) Follow-up urgent. Locate Patient Immediately.   C) Follow-up necessary. Contact patient and Schedule visit in ____ Days.   D) Follow-up Advised. Contact patient and Schedule visit in ____ Days. Please call him as this is unlike to no show, plus he is on a controlled substance.  E) Please Send no show letter to patient. Charge no show fee if no show was a CPE.

## 2020-11-17 ENCOUNTER — Encounter: Payer: Self-pay | Admitting: Medical

## 2020-11-17 NOTE — Telephone Encounter (Signed)
Lmom for patient to call and reschedule his appointment.

## 2022-09-01 ENCOUNTER — Encounter: Payer: Self-pay | Admitting: Internal Medicine
